# Patient Record
Sex: Male | Born: 1970 | Race: White | Hispanic: No | Marital: Married | Smoking: Never smoker
Health system: Southern US, Community
[De-identification: ages and names within clinical notes are randomized; demographics above are authoritative.]

## PROBLEM LIST (undated history)

## (undated) DIAGNOSIS — K219 Gastro-esophageal reflux disease without esophagitis: Secondary | ICD-10-CM

## (undated) DIAGNOSIS — R7989 Other specified abnormal findings of blood chemistry: Secondary | ICD-10-CM

## (undated) DIAGNOSIS — G4733 Obstructive sleep apnea (adult) (pediatric): Secondary | ICD-10-CM

## (undated) DIAGNOSIS — G43909 Migraine, unspecified, not intractable, without status migrainosus: Secondary | ICD-10-CM

## (undated) DIAGNOSIS — R945 Abnormal results of liver function studies: Secondary | ICD-10-CM

## (undated) DIAGNOSIS — K5792 Diverticulitis of intestine, part unspecified, without perforation or abscess without bleeding: Secondary | ICD-10-CM

## (undated) DIAGNOSIS — M6281 Muscle weakness (generalized): Secondary | ICD-10-CM

## (undated) DIAGNOSIS — E785 Hyperlipidemia, unspecified: Secondary | ICD-10-CM

## (undated) DIAGNOSIS — M75101 Unspecified rotator cuff tear or rupture of right shoulder, not specified as traumatic: Secondary | ICD-10-CM

## (undated) DIAGNOSIS — R29898 Other symptoms and signs involving the musculoskeletal system: Secondary | ICD-10-CM

## (undated) DIAGNOSIS — B179 Acute viral hepatitis, unspecified: Secondary | ICD-10-CM

## (undated) HISTORY — DX: Hyperlipidemia, unspecified: E78.5

## (undated) HISTORY — DX: Other specified abnormal findings of blood chemistry: R79.89

## (undated) HISTORY — DX: Obstructive sleep apnea (adult) (pediatric): G47.33

## (undated) HISTORY — DX: Acute viral hepatitis, unspecified: B17.9

## (undated) HISTORY — DX: Abnormal results of liver function studies: R94.5

## (undated) HISTORY — DX: Gastro-esophageal reflux disease without esophagitis: K21.9

## (undated) HISTORY — DX: Other symptoms and signs involving the musculoskeletal system: R29.898

## (undated) HISTORY — DX: Muscle weakness (generalized): M62.81

## (undated) HISTORY — DX: Diverticulitis of intestine, part unspecified, without perforation or abscess without bleeding: K57.92

---

## 1991-10-15 HISTORY — PX: INGUINAL HERNIA REPAIR: SUR1180

## 1997-10-14 HISTORY — PX: OTHER SURGICAL HISTORY: SHX169

## 1999-08-22 ENCOUNTER — Encounter: Admission: RE | Admit: 1999-08-22 | Discharge: 1999-08-22 | Payer: Self-pay | Admitting: Family Medicine

## 1999-08-22 ENCOUNTER — Encounter: Payer: Self-pay | Admitting: Family Medicine

## 1999-08-28 ENCOUNTER — Encounter: Admission: RE | Admit: 1999-08-28 | Discharge: 1999-08-28 | Payer: Self-pay | Admitting: Family Medicine

## 1999-08-28 ENCOUNTER — Encounter: Payer: Self-pay | Admitting: Family Medicine

## 2001-02-17 ENCOUNTER — Encounter: Payer: Self-pay | Admitting: Neurosurgery

## 2001-02-17 ENCOUNTER — Ambulatory Visit (HOSPITAL_COMMUNITY): Admission: RE | Admit: 2001-02-17 | Discharge: 2001-02-17 | Payer: Self-pay | Admitting: Neurosurgery

## 2001-02-17 HISTORY — PX: LUMBAR LAMINECTOMY/DECOMPRESSION MICRODISCECTOMY: SHX5026

## 2009-03-17 ENCOUNTER — Encounter: Admission: RE | Admit: 2009-03-17 | Discharge: 2009-03-17 | Payer: Self-pay | Admitting: Surgery

## 2009-09-06 ENCOUNTER — Ambulatory Visit (HOSPITAL_BASED_OUTPATIENT_CLINIC_OR_DEPARTMENT_OTHER): Admission: RE | Admit: 2009-09-06 | Discharge: 2009-09-06 | Payer: Self-pay | Admitting: Orthopedic Surgery

## 2009-09-06 HISTORY — PX: KNEE ARTHROSCOPY: SUR90

## 2011-01-16 LAB — POCT HEMOGLOBIN-HEMACUE: Hemoglobin: 16.8 g/dL (ref 13.0–17.0)

## 2011-03-01 NOTE — Op Note (Signed)
[place]  Patient:    Timothy Stout, Timothy Stout                   MRN: [id number] Proc. Date: 02/17/01 Adm. Date:  [id number] Attending:  Gerald Dexter                           Operative Report  PREOPERATIVE DIAGNOSIS:  Herniated disk L5-S1 right.  POSTOPERATIVE DIAGNOSIS:  Herniated disk L5-S1 right.  OPERATION PERFORMED:  Right L5-S1 interlaminal laminotomy for excision of herniated disk with operating microscope.  Microdissection of L5-S1 disk and S1 nerve root.  SURGEON:  Reinaldo Meeker, M.D.  ASSISTANT:  Julio Sicks, M.D.  ANESTHESIA:  General.  DESCRIPTION OF PROCEDURE:  After being placed in the prone position, the patients back was prepped and draped in the usual sterile fashion. Localizing x-ray was taken prior to incision to identify the appropriate level.  A midline incision was made above the spinous processes of L5 and S1. Using the Bovie cutting current, the incision was carried down to the spinous processes.  A subperiosteal dissection was then carried out along the right side of the spinous processes and lamina and McCullough self-retaining retractor was placed for exposure.  A second x-ray was taken to confirm approach to the L5-S1 level and this was correct.  Using the high speed drill, the inferior one third of the L5 lamina and the medial one third of the facet joint were removed.  Drill was then used to remove the superior one third of the S1 lamina.  Residual bone and ligamentum flavum were removed in a piecemeal fashion.  The microscope was draped and brought into the field and used for the remainder of the case.  Using microdissection technique, the lateral aspect of the thecal sac and S1 nerve root were identified.  Further coagulation was carried down toward the canal to identify the L5-S1 disk which was found to be markedly herniated beneath the nerve root.  After coagulating on the annulus, the annulus was  incised with a 15 blade.  Using pituitary rongeurs and curets a very thorough disk space clean out was carried out but at the same time great care was taken to avoid injury to the neural elements and this was successfully done.  At this point inspection was carried out in all directions for any evidence of residual compression and none could be identified.  Large amounts of irrigation were carried out and any bleeding controlled with bipolar coagulation and Gelfoam.  The wound was then closed using interrupted Vicryl on the muscle, fascia, subcutaneous and subcuticular tissues and staples on the skin.  Sterile dressing was then applied.  The patient was extubated and taken to recovery room in stable condition. DD:  02/17/01 TD:  02/17/01 Job: [id number]

## 2012-08-03 ENCOUNTER — Other Ambulatory Visit: Payer: Self-pay | Admitting: Orthopedic Surgery

## 2012-08-17 ENCOUNTER — Encounter (HOSPITAL_BASED_OUTPATIENT_CLINIC_OR_DEPARTMENT_OTHER): Payer: Self-pay | Admitting: *Deleted

## 2012-08-17 NOTE — Progress Notes (Signed)
NPO AFTER MN. ARRIVES AT 0700. NEEDS HG. 

## 2012-08-18 NOTE — Anesthesia Preprocedure Evaluation (Addendum)

## 2012-08-19 ENCOUNTER — Encounter (HOSPITAL_BASED_OUTPATIENT_CLINIC_OR_DEPARTMENT_OTHER): Payer: Self-pay | Admitting: Anesthesiology

## 2012-08-19 ENCOUNTER — Ambulatory Visit (HOSPITAL_BASED_OUTPATIENT_CLINIC_OR_DEPARTMENT_OTHER): Payer: Managed Care, Other (non HMO) | Admitting: Anesthesiology

## 2012-08-19 ENCOUNTER — Encounter (HOSPITAL_BASED_OUTPATIENT_CLINIC_OR_DEPARTMENT_OTHER)
Admission: RE | Disposition: A | Discharge status: Home / Self Care | Payer: Self-pay | Source: Ambulatory Visit | Attending: Orthopedic Surgery

## 2012-08-19 ENCOUNTER — Ambulatory Visit (HOSPITAL_BASED_OUTPATIENT_CLINIC_OR_DEPARTMENT_OTHER)
Admission: RE | Admit: 2012-08-19 | Discharge: 2012-08-19 | Disposition: A | Discharge status: Home / Self Care | Payer: Managed Care, Other (non HMO) | Source: Ambulatory Visit | Attending: Orthopedic Surgery | Admitting: Orthopedic Surgery

## 2012-08-19 ENCOUNTER — Encounter (HOSPITAL_BASED_OUTPATIENT_CLINIC_OR_DEPARTMENT_OTHER): Payer: Self-pay | Admitting: *Deleted

## 2012-08-19 DIAGNOSIS — M7540 Impingement syndrome of unspecified shoulder: Secondary | ICD-10-CM

## 2012-08-19 DIAGNOSIS — X58XXXA Exposure to other specified factors, initial encounter: Secondary | ICD-10-CM | POA: Insufficient documentation

## 2012-08-19 DIAGNOSIS — S43499A Other sprain of unspecified shoulder joint, initial encounter: Secondary | ICD-10-CM | POA: Insufficient documentation

## 2012-08-19 DIAGNOSIS — M25819 Other specified joint disorders, unspecified shoulder: Secondary | ICD-10-CM | POA: Insufficient documentation

## 2012-08-19 DIAGNOSIS — S46819A Strain of other muscles, fascia and tendons at shoulder and upper arm level, unspecified arm, initial encounter: Secondary | ICD-10-CM | POA: Insufficient documentation

## 2012-08-19 DIAGNOSIS — S43429A Sprain of unspecified rotator cuff capsule, initial encounter: Secondary | ICD-10-CM | POA: Insufficient documentation

## 2012-08-19 HISTORY — PX: SHOULDER ARTHROSCOPY WITH SUBACROMIAL DECOMPRESSION: SHX5684

## 2012-08-19 HISTORY — PX: SHOULDER ARTHROSCOPY WITH LABRAL REPAIR: SHX5691

## 2012-08-19 HISTORY — DX: Migraine, unspecified, not intractable, without status migrainosus: G43.909

## 2012-08-19 HISTORY — DX: Unspecified rotator cuff tear or rupture of right shoulder, not specified as traumatic: M75.101

## 2012-08-19 SURGERY — ARTHROSCOPY, SHOULDER, WITH GLENOID LABRUM REPAIR
Anesthesia: General | Site: Shoulder | Laterality: Right

## 2012-08-19 MED ORDER — ONDANSETRON HCL 4 MG/2ML IJ SOLN
INTRAMUSCULAR | Status: DC | PRN
  Administered 2012-08-19: 4 mg via INTRAVENOUS

## 2012-08-19 MED ORDER — PROMETHAZINE HCL 25 MG PO TABS
25.0000 mg | ORAL_TABLET | Freq: Four times a day (QID) | ORAL | Status: DC | PRN
  Administered 2012-08-19: 25 mg via ORAL
  Filled 2012-08-19: qty 1

## 2012-08-19 MED ORDER — FENTANYL CITRATE 0.05 MG/ML IJ SOLN
150.0000 ug | Freq: Once | INTRAMUSCULAR | Status: AC
  Administered 2012-08-19: 150 ug via INTRAVENOUS
  Filled 2012-08-19: qty 3

## 2012-08-19 MED ORDER — POVIDONE-IODINE 7.5 % EX SOLN
Freq: Once | CUTANEOUS | Status: DC
  Filled 2012-08-19: qty 118

## 2012-08-19 MED ORDER — FENTANYL CITRATE 0.05 MG/ML IJ SOLN
INTRAMUSCULAR | Status: DC | PRN
  Administered 2012-08-19: 50 ug via INTRAVENOUS

## 2012-08-19 MED ORDER — MIDAZOLAM BOLUS VIA INFUSION
2.0000 mg | Freq: Once | INTRAVENOUS | Status: AC
  Administered 2012-08-19: 1 mg via INTRAVENOUS
  Filled 2012-08-19: qty 2

## 2012-08-19 MED ORDER — LIDOCAINE HCL (CARDIAC) 20 MG/ML IV SOLN
INTRAVENOUS | Status: DC | PRN
  Administered 2012-08-19: 100 mg via INTRAVENOUS

## 2012-08-19 MED ORDER — DEXAMETHASONE SODIUM PHOSPHATE 4 MG/ML IJ SOLN
INTRAMUSCULAR | Status: DC | PRN
  Administered 2012-08-19: 10 mg via INTRAVENOUS

## 2012-08-19 MED ORDER — LACTATED RINGERS IV SOLN
INTRAVENOUS | Status: DC
  Filled 2012-08-19: qty 1000

## 2012-08-19 MED ORDER — OXYCODONE-ACETAMINOPHEN 7.5-325 MG PO TABS: 1.0000 | ORAL_TABLET | ORAL | Status: DC | PRN

## 2012-08-19 MED ORDER — FENTANYL CITRATE 0.05 MG/ML IJ SOLN
25.0000 ug | INTRAMUSCULAR | Status: DC | PRN
  Filled 2012-08-19: qty 1

## 2012-08-19 MED ORDER — SODIUM CHLORIDE 0.9 % IR SOLN
Status: DC | PRN
  Administered 2012-08-19: 09:00:00

## 2012-08-19 MED ORDER — ROPIVACAINE HCL 5 MG/ML IJ SOLN
INTRAMUSCULAR | Status: DC | PRN
  Administered 2012-08-19: 30 mL

## 2012-08-19 MED ORDER — SUCCINYLCHOLINE CHLORIDE 20 MG/ML IJ SOLN
INTRAMUSCULAR | Status: DC | PRN
  Administered 2012-08-19: 100 mg via INTRAVENOUS

## 2012-08-19 MED ORDER — PROPOFOL 10 MG/ML IV BOLUS
INTRAVENOUS | Status: DC | PRN
  Administered 2012-08-19: 200 mg via INTRAVENOUS

## 2012-08-19 MED ORDER — PROMETHAZINE HCL 50 MG PO TABS: 50.0000 mg | ORAL_TABLET | Freq: Four times a day (QID) | ORAL | Status: DC | PRN

## 2012-08-19 MED ORDER — METHOCARBAMOL 500 MG PO TABS: 500.0000 mg | ORAL_TABLET | Freq: Four times a day (QID) | ORAL | Status: DC

## 2012-08-19 MED ORDER — FENTANYL CITRATE 0.05 MG/ML IJ SOLN
100.0000 ug | Freq: Once | INTRAMUSCULAR | Status: DC
  Filled 2012-08-19: qty 2

## 2012-08-19 MED ORDER — BUPIVACAINE-EPINEPHRINE 0.5% -1:200000 IJ SOLN
INTRAMUSCULAR | Status: DC | PRN
  Administered 2012-08-19: 12 mL

## 2012-08-19 MED ORDER — LACTATED RINGERS IV SOLN
INTRAVENOUS | Status: DC
  Administered 2012-08-19: 100 mL/h via INTRAVENOUS
  Administered 2012-08-19: 09:00:00 via INTRAVENOUS
  Filled 2012-08-19: qty 1000

## 2012-08-19 SURGICAL SUPPLY — 75 items
APL SKNCLS STERI-STRIP NONHPOA (GAUZE/BANDAGES/DRESSINGS)
BENZOIN TINCTURE PRP APPL 2/3 (GAUZE/BANDAGES/DRESSINGS) IMPLANT
BLADE 4.2CUDA (BLADE) ×2 IMPLANT
BLADE CUDA 4.2 (BLADE) IMPLANT
BLADE CUDA 5.5 (BLADE) IMPLANT
BLADE CUDA SHAVER 3.5 (BLADE) IMPLANT
BLADE CUTTER GATOR 3.5 (BLADE) IMPLANT
BLADE FLAT COURSE (BLADE) IMPLANT
BLADE GREAT WHITE 4.2 (BLADE) IMPLANT
BLADE SURG 10 STRL SS (BLADE) IMPLANT
BLADE SURG 15 STRL LF DISP TIS (BLADE) IMPLANT
BLADE SURG 15 STRL SS (BLADE)
BUR OVAL 4.0 (BURR) ×2 IMPLANT
CANISTER SUCT LVC 12 LTR MEDI- (MISCELLANEOUS) ×3 IMPLANT
CANISTER SUCTION 1200CC (MISCELLANEOUS) ×2 IMPLANT
CANISTER SUCTION 2500CC (MISCELLANEOUS) ×1 IMPLANT
CLOTH BEACON ORANGE TIMEOUT ST (SAFETY) ×2 IMPLANT
DRAPE LG THREE QUARTER DISP (DRAPES) ×4 IMPLANT
DRAPE SHOULDER BEACH CHAIR (DRAPES) ×2 IMPLANT
DRAPE U-SHAPE 47X51 STRL (DRAPES) ×2 IMPLANT
DRSG ADAPTIC 3X8 NADH LF (GAUZE/BANDAGES/DRESSINGS) ×2 IMPLANT
DRSG EMULSION OIL 3X3 NADH (GAUZE/BANDAGES/DRESSINGS) ×1 IMPLANT
DRSG PAD ABDOMINAL 8X10 ST (GAUZE/BANDAGES/DRESSINGS) ×2 IMPLANT
DURAPREP 26ML APPLICATOR (WOUND CARE) ×2 IMPLANT
ELECT MENISCUS 165MM 90D (ELECTRODE) IMPLANT
ELECT REM PT RETURN 9FT ADLT (ELECTROSURGICAL) ×2
ELECTRODE REM PT RTRN 9FT ADLT (ELECTROSURGICAL) ×1 IMPLANT
GLOVE BIO SURGEON STRL SZ 6.5 (GLOVE) ×2 IMPLANT
GLOVE BIOGEL PI IND STRL 8 (GLOVE) ×1 IMPLANT
GLOVE BIOGEL PI INDICATOR 8 (GLOVE) ×1
GLOVE ECLIPSE 6.0 STRL STRAW (GLOVE) ×1 IMPLANT
GLOVE ECLIPSE 8.0 STRL XLNG CF (GLOVE) ×3 IMPLANT
GLOVE INDICATOR 8.0 STRL GRN (GLOVE) ×3 IMPLANT
GOWN PREVENTION PLUS LG XLONG (DISPOSABLE) ×2 IMPLANT
GOWN STRL REIN XL XLG (GOWN DISPOSABLE) ×4 IMPLANT
IV NS IRRIG 3000ML ARTHROMATIC (IV SOLUTION) ×4 IMPLANT
NDL 1/2 CIR CATGUT .05X1.09 (NEEDLE) IMPLANT
NDL HYPO 18GX1.5 BLUNT FILL (NEEDLE) ×1 IMPLANT
NDL SAFETY ECLIPSE 18X1.5 (NEEDLE) ×1 IMPLANT
NEEDLE 1/2 CIR CATGUT .05X1.09 (NEEDLE) IMPLANT
NEEDLE HYPO 18GX1.5 BLUNT FILL (NEEDLE) ×2 IMPLANT
NEEDLE HYPO 18GX1.5 SHARP (NEEDLE) ×2
NEEDLE HYPO 22GX1.5 SAFETY (NEEDLE) IMPLANT
NS IRRIG 500ML POUR BTL (IV SOLUTION) ×1 IMPLANT
PACK ARTHROSCOPY DSU (CUSTOM PROCEDURE TRAY) ×2 IMPLANT
PACK BASIN DAY SURGERY FS (CUSTOM PROCEDURE TRAY) ×2 IMPLANT
PENCIL BUTTON HOLSTER BLD 10FT (ELECTRODE) IMPLANT
SET ARTHROSCOPY TUBING (MISCELLANEOUS) ×2
SET ARTHROSCOPY TUBING LN (MISCELLANEOUS) ×1 IMPLANT
SLING ARM IMMOBILIZER LRG (SOFTGOODS) ×1 IMPLANT
SPONGE GAUZE 4X4 12PLY (GAUZE/BANDAGES/DRESSINGS) ×2 IMPLANT
SPONGE SURGIFOAM ABS GEL 100 (HEMOSTASIS) IMPLANT
STAPLER VISISTAT (STAPLE) IMPLANT
STRIP CLOSURE SKIN 1/2X4 (GAUZE/BANDAGES/DRESSINGS) IMPLANT
SUCTION FRAZIER TIP 10 FR DISP (SUCTIONS) ×1 IMPLANT
SUT BONE WAX W31G (SUTURE) IMPLANT
SUT ETHIBOND GREEN BRAID 0S 4 (SUTURE) IMPLANT
SUT ETHIBOND NAB CT1 #1 30IN (SUTURE) IMPLANT
SUT ETHILON 4 0 PS 2 18 (SUTURE) ×3 IMPLANT
SUT VIC AB 0 CT1 36 (SUTURE) IMPLANT
SUT VIC AB 1 CT1 36 (SUTURE) ×1 IMPLANT
SUT VIC AB 2-0 CT1 27 (SUTURE)
SUT VIC AB 2-0 CT1 TAPERPNT 27 (SUTURE) ×1 IMPLANT
SUT VIC AB 3-0 CT1 27 (SUTURE)
SUT VIC AB 3-0 CT1 TAPERPNT 27 (SUTURE) IMPLANT
SUT VIC AB 3-0 SH 27 (SUTURE)
SUT VIC AB 3-0 SH 27X BRD (SUTURE) IMPLANT
SUT VICRYL 4-0 PS2 18IN ABS (SUTURE) IMPLANT
SYR BULB IRRIGATION 50ML (SYRINGE) ×2 IMPLANT
SYRINGE 10CC LL (SYRINGE) ×2 IMPLANT
TAPE HYPAFIX 6X30 (GAUZE/BANDAGES/DRESSINGS) ×1 IMPLANT
TOWEL OR 17X24 6PK STRL BLUE (TOWEL DISPOSABLE) ×2 IMPLANT
TUBE CONNECTING 12X1/4 (SUCTIONS) ×2 IMPLANT
WAND 90 DEG TURBOVAC W/CORD (SURGICAL WAND) ×2 IMPLANT
WATER STERILE IRR 500ML POUR (IV SOLUTION) ×2 IMPLANT

## 2012-08-19 NOTE — Transfer of Care (Signed)
Immediate Anesthesia Transfer of Care Note  Patient: Timothy Stout  Procedure(s) Performed: Procedure(s) (LRB) with comments: SHOULDER ARTHROSCOPY WITH LABRAL REPAIR (Right) - shaving of rotaor cuff SHOULDER ARTHROSCOPY WITH SUBACROMIAL DECOMPRESSION (Right)  Patient Location: PACU  Anesthesia Type:General  Level of Consciousness: awake, alert  and oriented  Airway & Oxygen Therapy: Patient Spontanous Breathing and Patient connected to face mask oxygen  Post-op Assessment: Report given to PACU RN  Post vital signs: Reviewed and stable  Complications: No apparent anesthesia complications

## 2012-08-19 NOTE — Anesthesia Postprocedure Evaluation (Signed)
  Anesthesia Post-op Note  Patient: Timothy Stout  Procedure(s) Performed: Procedure(s) (LRB): SHOULDER ARTHROSCOPY WITH LABRAL REPAIR (Right) SHOULDER ARTHROSCOPY WITH SUBACROMIAL DECOMPRESSION (Right)  Patient Location: PACU  Anesthesia Type: General  Level of Consciousness: awake and alert   Airway and Oxygen Therapy: Patient Spontanous Breathing  Post-op Pain: mild  Post-op Assessment: Post-op Vital signs reviewed, Patient's Cardiovascular Status Stable, Respiratory Function Stable, Patent Airway and No signs of Nausea or vomiting  Post-op Vital Signs: stable  Complications: No apparent anesthesia complications

## 2012-08-19 NOTE — Brief Op Note (Signed)
08/19/2012  9:58 AM  PATIENT:  [name]  41 y.o. male  PRE-OPERATIVE DIAGNOSIS:  right shoulder partial rotator cuff tear   POST-OPERATIVE DIAGNOSIS:  right shoulder partial rotator cuff tear   PROCEDURE:  Procedure(s) (LRB) with comments: SHOULDER ARTHROSCOPY WITH LABRAL REPAIR (Right) - shaving of rotaor cuff SHOULDER ARTHROSCOPY WITH SUBACROMIAL DECOMPRESSION (Right)  SURGEON:  Surgeon(s) and Role:    * Drucilla Schmidt, MD - Primary  PHYSICIAN ASSISTANT:   ASSISTANTS:extra scrub nurse  ANESTHESIA:   regional and general  EBL:  Total I/O In: 1000 [I.V.:1000] Out: -   BLOOD ADMINISTERED:none  DRAINS: none   LOCAL MEDICATIONS USED:  MARCAINE     SPECIMEN:  No Specimen  DISPOSITION OF SPECIMEN:  N/A  COUNTS:  YES  TOURNIQUET:  * No tourniquets in log *  DICTATION: .Other Dictation: Dictation Number [id number]  PLAN OF CARE: Discharge to home after PACU  PATIENT DISPOSITION:  PACU - hemodynamically stable.   Delay start of Pharmacological VTE agent (>24hrs) due to surgical blood loss or risk of bleeding: yes

## 2012-08-19 NOTE — Anesthesia Procedure Notes (Addendum)
Anesthesia Regional Block:  Interscalene brachial plexus block  Pre-Anesthetic Checklist: ,, timeout performed, Correct Patient, Correct Site, Correct Laterality, Correct Procedure, Correct Position, site marked, Risks and benefits discussed,  Surgical consent,  Pre-op evaluation,  At surgeon's request and post-op pain management  Laterality: Right  Prep: chloraprep       Needles:  Injection technique: Single-shot  Needle Type: Stimiplex          Additional Needles:  Procedures: ultrasound guided (picture in chart) Interscalene brachial plexus block Narrative:  Start time: 08/19/2012 8:20 AM End time: 08/19/2012 8:30 AM Injection made incrementally with aspirations every 5 mL.  Performed by: Personally  Anesthesiologist: Gaetano Hawthorne MD  Additional Notes: Patient tolerated the procedure well without complications  Interscalene brachial plexus block Procedure Name: Intubation Date/Time: 08/19/2012 8:47 AM Performed by: Maris Berger T Pre-anesthesia Checklist: Patient identified, Emergency Drugs available, Suction available and Patient being monitored Patient Re-evaluated:Patient Re-evaluated prior to inductionOxygen Delivery Method: Circle System Utilized Preoxygenation: Pre-oxygenation with 100% oxygen Intubation Type: IV induction Ventilation: Mask ventilation without difficulty Laryngoscope Size: Mac and 4 Tube type: Oral Tube size: 8.0 mm Number of attempts: 1 Airway Equipment and Method: stylet and oral airway Placement Confirmation: ETT inserted through vocal cords under direct vision,  positive ETCO2 and breath sounds checked- equal and bilateral Secured at: 23 cm Tube secured with: Tape Dental Injury: Teeth and Oropharynx as per pre-operative assessment

## 2012-08-19 NOTE — H&P (Signed)
Timothy Stout is an 41 y.o. male.   Chief Complaint: painful rt shoulder HPI:MRI demonstrates partial rotator cuff tear  Past Medical History  Diagnosis Date  . Rotator cuff tear, right   . Migraine     Past Surgical History  Procedure Date  . Knee arthroscopy 09-06-2009    LEFT KNEE  . Lumbar laminectomy/decompression microdiscectomy 02-17-2001    L5 - S1  . Inguinal hernia repair 1993  . Right knee arthroscopy 1999    History reviewed. No pertinent family history. Social History:  reports that he has never smoked. He has never used smokeless tobacco. He reports that he drinks alcohol. He reports that he does not use illicit drugs.  Allergies: No Known Allergies  Medications Prior to Admission  Medication Sig Dispense Refill  . aspirin 81 MG tablet Take 81 mg by mouth daily.      Marland Kitchen ibuprofen (ADVIL,MOTRIN) 200 MG tablet Take 200 mg by mouth every 6 (six) hours as needed.      . Magnesium 400 MG CAPS Take 1 capsule by mouth daily.      . naproxen sodium (ANAPROX) 220 MG tablet Take 220 mg by mouth as needed.      . rizatriptan (MAXALT) 10 MG tablet Take 10 mg by mouth as needed. May repeat in 2 hours if needed        No results found for this or any previous visit (from the past 48 hour(s)). No results found.  ROS  Blood pressure 134/85, pulse 74, temperature 97.4 F (36.3 C), temperature source Oral, resp. rate 20, height 5\' 10"  (1.778 m), weight 93.583 kg (206 lb 5 oz), SpO2 99.00%. Physical Exam  Constitutional: He is oriented to person, place, and time. He appears well-developed and well-nourished.  HENT:  Head: Normocephalic and atraumatic.  Right Ear: External ear normal.  Left Ear: External ear normal.  Nose: Nose normal.  Mouth/Throat: Oropharynx is clear and moist.  Eyes: Conjunctivae normal and EOM are normal. Pupils are equal, round, and reactive to light.  Neck: Normal range of motion. Neck supple.  Cardiovascular: Normal rate, regular rhythm,  normal heart sounds and intact distal pulses.   Respiratory: Effort normal and breath sounds normal.  GI: Soft. Bowel sounds are normal.  Musculoskeletal: Normal range of motion. He exhibits tenderness.       Tender rotator cuff rt shoulder  Neurological: He is alert and oriented to person, place, and time. He has normal reflexes.  Skin: Skin is warm and dry.  Psychiatric: He has a normal mood and affect. His behavior is normal. Judgment and thought content normal.     Assessment/Plan Partial rotator cuff tear rt shoulder Rt shoulder arthroscopy wit SAD and shaving rotator cuff  APLINGTON,JAMES P 08/19/2012, 8:22 AM

## 2012-08-20 ENCOUNTER — Encounter (HOSPITAL_BASED_OUTPATIENT_CLINIC_OR_DEPARTMENT_OTHER): Payer: Self-pay | Admitting: Orthopedic Surgery

## 2012-08-20 NOTE — Op Note (Signed)
NAME:  Timothy Stout, Timothy Stout NO.:  [id number]  MEDICAL RECORD NO.:  [id number]  LOCATION:                                 FACILITY:  PHYSICIAN:  Marlowe Kays, M.D.       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  OPERATION: 1. Right shoulder arthroscopy with debridement of labrum and shaving     of articular surface of rotator cuff. 2. Arthroscopic subacromial decompression with shaving of bursal     surface of the rotator cuff.  SURGEON:  Marlowe Kays, MD  ASSISTANT:  Extra scrub tech.  ANESTHESIA:  General preceded by interscalene block.  PATHOLOGY AND JUSTIFICATION FOR PROCEDURE:  Painful right shoulder with an MRI demonstrating the preoperative diagnoses.  PROCEDURE:  Satisfactory interscalene block by anesthesia, satisfied general anesthesia, beach chair position on sliding frame.  Right shoulder girdle was prepped with DuraPrep, draped in sterile field. Time-out performed.  Anatomy of shoulder joint was marked out and for hemostatic purposes, I injected the posterior soft spot portal, the lateral portal, and the subacromial space with Marcaine with adrenaline. I then was able to atraumatically enter the glenohumeral joint with the blunt trocar and on inspection the MRI findings were confirmed.  The biceps tendon, humeral head looked normal.  There were no arthritic changes in the joint but there was tearing of the labrum, which I pictured.  I also pictured the articular surface tearing of the undersurface of the rotator cuff.  Accordingly, I advanced the scope between the biceps tendon and subscapularis and using a switching stick, made an anterior  incision over which I placed a metal cannula followed by 4.2 shaver entering in the joint.  I then debrided down the labrum and the undersurface of the rotator cuff.  Final pictures were taken.  I then directed the scope in the subacromial space and through a lateral portal introduced a blunt trocar followed by 4.2 shaver.  He had a very dense subacromial bursa.  I removed lot of that with the 4.2 shaver and then used the 90-degree ArthroCare vaporizer to remove soft tissue from the undersurface of the acromion and some additional bursal tissue.  I followed this with a 4 mm oval bur, burring down the undersurface of the acromion and moved back and forth between these 3 instruments until we had a wide decompression, roughened up bursal surface of the rotator cuff.  I shaved as well with the 4.2 shaver.  When I felt that we had a wide decompression and I shaved the rotator cuff as much as needed, I also inspected it to make sure there was no full-thickness tear.  All fluid possible was removed and I closed the portals with 4-0 nylon. Betadine, Adaptic, dry sterile dressing, shoulder immobilizer applied. He tolerated the procedure well and was taken to the recovery room in satisfactory condition with no known complications.          ______________________________  Marlowe Kays, M.D.     JA/MEDQ  D:  08/19/2012  T:  08/20/2012  Job:  [id number]

## 2013-02-22 ENCOUNTER — Other Ambulatory Visit: Payer: Self-pay

## 2013-02-22 ENCOUNTER — Emergency Department (HOSPITAL_COMMUNITY)
Admission: EM | Admit: 2013-02-22 | Discharge: 2013-02-22 | Disposition: A | Discharge status: Home / Self Care | Payer: Managed Care, Other (non HMO) | Attending: Emergency Medicine | Admitting: Emergency Medicine

## 2013-02-22 ENCOUNTER — Emergency Department (HOSPITAL_COMMUNITY): Payer: Managed Care, Other (non HMO)

## 2013-02-22 DIAGNOSIS — Z87828 Personal history of other (healed) physical injury and trauma: Secondary | ICD-10-CM | POA: Insufficient documentation

## 2013-02-22 DIAGNOSIS — Z7982 Long term (current) use of aspirin: Secondary | ICD-10-CM | POA: Insufficient documentation

## 2013-02-22 DIAGNOSIS — G43909 Migraine, unspecified, not intractable, without status migrainosus: Secondary | ICD-10-CM | POA: Insufficient documentation

## 2013-02-22 DIAGNOSIS — R079 Chest pain, unspecified: Secondary | ICD-10-CM

## 2013-02-22 LAB — CBC
Hemoglobin: 15.9 g/dL (ref 13.0–17.0)
MCH: 31.6 pg (ref 26.0–34.0)
MCV: 87.3 fL (ref 78.0–100.0)
RBC: 5.03 MIL/uL (ref 4.22–5.81)

## 2013-02-22 LAB — POCT I-STAT TROPONIN I

## 2013-02-22 LAB — BASIC METABOLIC PANEL
BUN: 14 mg/dL (ref 6–23)
CO2: 30 mEq/L (ref 19–32)
Calcium: 9.7 mg/dL (ref 8.4–10.5)
Glucose, Bld: 98 mg/dL (ref 70–99)
Sodium: 140 mEq/L (ref 135–145)

## 2013-02-22 LAB — D-DIMER, QUANTITATIVE: D-Dimer, Quant: <0.27 ug/mL-FEU (ref 0.00–0.48)

## 2013-02-22 MED ORDER — ASPIRIN 81 MG PO CHEW
CHEWABLE_TABLET | ORAL | Status: AC
  Filled 2013-02-22: qty 4

## 2013-02-22 MED ORDER — ASPIRIN 325 MG PO TABS: 325.0000 mg | ORAL_TABLET | ORAL | Status: DC

## 2013-02-22 MED ORDER — ASPIRIN 81 MG PO CHEW
324.0000 mg | CHEWABLE_TABLET | Freq: Once | ORAL | Status: AC
  Administered 2013-02-22: 243 mg via ORAL

## 2013-02-22 NOTE — ED Notes (Signed)
Pt c/o Right side jaw tightness and then just after that subsided he developed Right side chest pain starting approx 3 hours ago. Pt denies N/V, abd/back pain, cough, congestion, weakness, diaphoretic, SOB, or dizziness. Pt currently pain free, pt does report increase indigestion after the chest tightness.

## 2013-02-22 NOTE — ED Notes (Addendum)
Pt alert, NAD, calm, interactive, skin dry, resps e/u, speaking in clear complete sentences, to xray via stretcher, visitor remains in room. (denies: pain, nausea, sob or other sx).

## 2013-02-22 NOTE — ED Provider Notes (Signed)
History     CSN: [id number]  Arrival date & time 02/22/13  0040   First MD Initiated Contact with Patient 02/22/13 [phone]      Chief Complaint  Patient presents with  . Chest Pain   HPI  History provided by the patient. Patient is a 42 year old male with history of migraine headaches previous right rotator cuff surgery who presents with complaints of brief episode of central chest pain. Symptoms occurred briefly wet prior to arrival. Patient states she first felt tightness and unusual sensation to his lower jaw. This was followed by a central chest soreness and heaviness that lasted 30 seconds. Patient was at rest at that time. Symptoms have not returned. Patient denies having similar symptoms previously. He does report recently traveling back from [place] with family after visiting amusement parks. They drove but had frequent stops every 2-3 hours. He denied having any swelling or pain to his lower extremities. Denies any associated cough, shortness of breath or heart palpitations. Denies any hemoptysis. Denies any nausea or vomiting. No sweats. Patient does also mention having 2 large meals today to celebrate Mother's Day which was unusual. He denied having any heartburn or abdominal pain. No other aggravating or alleviating factors. No other associated symptoms.     Past Medical History  Diagnosis Date  . Rotator cuff tear, right   . Migraine     Past Surgical History  Procedure Laterality Date  . Knee arthroscopy  09-06-2009    LEFT KNEE  . Lumbar laminectomy/decompression microdiscectomy  02-17-2001    L5 - S1  . Inguinal hernia repair  1993  . Right knee arthroscopy  1999  . Shoulder arthroscopy with labral repair  08/19/2012    Procedure: SHOULDER ARTHROSCOPY WITH LABRAL REPAIR;  Surgeon: Drucilla Schmidt, MD;  Location: [facility];  Service: Orthopedics;  Laterality: Right;  shaving of rotaor cuff  . Shoulder arthroscopy with subacromial decompression   08/19/2012    Procedure: SHOULDER ARTHROSCOPY WITH SUBACROMIAL DECOMPRESSION;  Surgeon: Drucilla Schmidt, MD;  Location: [facility];  Service: Orthopedics;  Laterality: Right;    No family history on file.  History  Substance Use Topics  . Smoking status: Never Smoker   . Smokeless tobacco: Never Used  . Alcohol Use: Yes     Comment: OCCASIONAL      Review of Systems  Constitutional: Negative for fever, chills and diaphoresis.  Respiratory: Negative for cough and shortness of breath.   Cardiovascular: Positive for chest pain. Negative for palpitations.  Gastrointestinal: Negative for nausea, vomiting and abdominal pain.  All other systems reviewed and are negative.    Allergies  Review of patient's allergies indicates no known allergies.  Home Medications   Current Outpatient Rx  Name  Route  Sig  Dispense  Refill  . aspirin 81 MG tablet   Oral   Take 81 mg by mouth daily.         . magnesium oxide (MAG-OX) 400 MG tablet   Oral   Take 400 mg by mouth daily.         . rizatriptan (MAXALT) 10 MG tablet   Oral   Take 10 mg by mouth as needed. May repeat in 2 hours if needed           BP 131/85  Pulse 85  Temp(Src) 98.7 F (37.1 C)  Resp 17  SpO2 97%  Physical Exam  Nursing note and vitals reviewed. Constitutional: He is oriented to person,  place, and time. He appears well-developed and well-nourished. No distress.  HENT:  Head: Normocephalic.  Cardiovascular: Normal rate and regular rhythm.   No murmur heard. Pulmonary/Chest: Effort normal and breath sounds normal. No respiratory distress. He has no wheezes. He has no rales.  Abdominal: Soft. There is no tenderness. There is no rebound and no guarding.  Musculoskeletal: Normal range of motion. He exhibits no edema and no tenderness.  No clinical signs concerning for DVT  Neurological: He is alert and oriented to person, place, and time.  Skin: Skin is warm. He is not diaphoretic.   Psychiatric: He has a normal mood and affect. His behavior is normal.    ED Course  Procedures   Results for orders placed during the hospital encounter of 02/22/13  CBC      Result Value Range   WBC 6.2  4.0 - 10.5 K/uL   RBC 5.03  4.22 - 5.81 MIL/uL   Hemoglobin 15.9  13.0 - 17.0 g/dL   HCT 45.4  09.8 - 11.9 %   MCV 87.3  78.0 - 100.0 fL   MCH 31.6  26.0 - 34.0 pg   MCHC 36.2 (*) 30.0 - 36.0 g/dL   RDW 14.7  82.9 - 56.2 %   Platelets 156  150 - 400 K/uL  BASIC METABOLIC PANEL      Result Value Range   Sodium 140  135 - 145 mEq/L   Potassium 4.1  3.5 - 5.1 mEq/L   Chloride 103  96 - 112 mEq/L   CO2 30  19 - 32 mEq/L   Glucose, Bld 98  70 - 99 mg/dL   BUN 14  6 - 23 mg/dL   Creatinine, Ser 1.30  0.50 - 1.35 mg/dL   Calcium 9.7  8.4 - 86.5 mg/dL   GFR calc non Af Amer 89 (*) >90 mL/min   GFR calc Af Amer >90  >90 mL/min  D-DIMER, QUANTITATIVE      Result Value Range   D-Dimer, Quant <0.27  0.00 - 0.48 ug/mL-FEU  POCT I-STAT TROPONIN I      Result Value Range   Troponin i, poc 0.00  0.00 - 0.08 ng/mL   Comment 3           POCT I-STAT TROPONIN I      Result Value Range   Troponin i, poc 0.00  0.00 - 0.08 ng/mL   Comment 3                Dg Chest 2 View  02/22/2013  *RADIOLOGY REPORT*  Clinical Data: Chest pain  CHEST - 2 VIEW  Comparison: 03/17/2009 abdominal CT  Findings: Lungs are clear. No pleural effusion or pneumothorax. The cardiomediastinal contours are within normal limits. The visualized bones and soft tissues are without significant appreciable abnormality.  IMPRESSION: No radiographic evidence of acute cardiopulmonary process.   Original Report Authenticated By: Jearld Lesch, M.D.      1. Chest pain       MDM  3:15 AM patient seen and evaluated. Patient resting comfortably he appears well in no acute distress. Currently without symptoms.  Patient with brief 30 seconds central chest pain without recurrence. Labs, EKG, chest x-ray troponin x2,  d-dimer unremarkable. This time patient is stable for discharge home to followup with PCP.       Date: 02/22/2013  Rate: 66  Rhythm: normal sinus rhythm and sinus arrhythmia  QRS Axis: normal  Intervals: normal  ST/T Wave abnormalities:  normal  Conduction Disutrbances:none  Narrative Interpretation:   Old EKG Reviewed: none available    Angus Seller, PA-C 02/22/13 [phone]

## 2013-02-22 NOTE — ED Notes (Signed)
ekg done at 0046 hours

## 2013-02-22 NOTE — ED Notes (Signed)
Pt c/o central and right sided chest pain with radiation to jaw. Pain exacerbated by nothing, Pain relived by nothing. Pain currently resolved. Lasted 30 seconds

## 2013-02-22 NOTE — ED Provider Notes (Signed)
Medical screening examination/treatment/procedure(s) were performed by non-physician practitioner and as supervising physician I was immediately available for consultation/collaboration.   Charles B. Sheldon, MD 02/22/13 0629 

## 2013-02-22 NOTE — ED Notes (Signed)
The patient is AOx4 and comfortable with his discharge instructions. 

## 2013-02-22 NOTE — ED Notes (Signed)
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

## 2014-03-01 ENCOUNTER — Encounter (INDEPENDENT_AMBULATORY_CARE_PROVIDER_SITE_OTHER): Payer: Self-pay

## 2014-03-01 ENCOUNTER — Encounter: Payer: Self-pay | Admitting: Cardiology

## 2014-03-01 ENCOUNTER — Ambulatory Visit (INDEPENDENT_AMBULATORY_CARE_PROVIDER_SITE_OTHER): Payer: BC Managed Care – PPO | Admitting: Cardiology

## 2014-03-01 VITALS — BP 144/88 | HR 79 | Ht 70.0 in | Wt 217.0 lb

## 2014-03-01 DIAGNOSIS — R079 Chest pain, unspecified: Secondary | ICD-10-CM | POA: Insufficient documentation

## 2014-03-01 NOTE — Patient Instructions (Signed)
Your physician recommends that you continue on your current medications as directed. Please refer to the Current Medication list given to you today.  Your physician has requested that you have an exercise stress myoview. For further information please visit [email or web address]. Please follow instruction sheet, as given.  Your physician recommends that you schedule a follow-up appointment As Needed 

## 2014-03-01 NOTE — Progress Notes (Signed)
  [address] [place], [place]  [address] Phone: [phone] Fax:  [phone]  Date:  03/01/2014   ID:  Timothy Stout, DOB 20-Dec-1970, MRN [id number]  PCP:  Lolita PatellaEADE,ROBERT ALEXANDER, MD  Cardiologist:  Armanda Magicraci Timothy Gafford, MD     History of Present Illness: Timothy Stout is a 43 y.o. male with a no prior cardiac history presents for cardiac clearance for .  He was seen in the ER a year ago with complaints of chest pain located centrally that he describes as a tightness with radiation to the jaw and chest soreness.  This occurred after he had had 2 large meals the same day on Mother's day.  He denied any associated symptoms of SOB, diaphoresis or nausea.  Cardiac workup was normal in the ER and he is now presents for further evaluation. He was supposed to followup a year ago but did not but started to get worried since he has been having some muscle soreness in his chest that is nonexertional.  He can jog on the treadmill or run around the soccer field without any problems. He denies any SOB, DOE, LE edema, dizziness, palpitations or syncope.   Wt Readings from Last 3 Encounters:  03/01/14 217 lb (98.431 kg)  08/19/12 206 lb 5 oz (93.583 kg)  08/19/12 206 lb 5 oz (93.583 kg)     Past Medical History  Diagnosis Date  . Rotator cuff tear, right   . Migraine     Current Outpatient Prescriptions  Medication Sig Dispense Refill  . aspirin 81 MG tablet Take 81 mg by mouth daily.      . magnesium oxide (MAG-OX) 400 MG tablet Take 400 mg by mouth daily.      . rizatriptan (MAXALT) 10 MG tablet Take 10 mg by mouth as needed. May repeat in 2 hours if needed       No current facility-administered medications for this visit.    Allergies:   No Known Allergies  Social History:  The patient  reports that he has never smoked. He has never used smokeless tobacco. He reports that he drinks alcohol. He reports that he does not use illicit drugs.   Family History:  The  patient's family history is not on file.   ROS:  Please see the history of present illness.      All other systems reviewed and negative.   PHYSICAL EXAM: VS:  BP 144/88  Pulse 79  Ht 5\' 10"  (1.778 m)  Wt 217 lb (98.431 kg)  BMI 31.14 kg/m2 Well nourished, well developed, in no acute distress HEENT: normal Neck: no JVD Cardiac:  normal S1, S2; RRR; no murmur Lungs:  clear to auscultation bilaterally, no wheezing, rhonchi or rales Abd: soft, nontender, no hepatomegaly Ext: no edema Skin: warm and dry Neuro:  CNs 2-12 intact, no focal abnormalities noted  EKG:     NSR with rightward axis  ASSESSMENT AND PLAN:  1. Chest pain with typical and atypical components.  EKG is nonischemic. - check stress myoview - check FLP since he has not had his lipids checked in 2 years   Followup with me PRN  Signed, Armanda Magicraci Shakora Nordquist, MD 03/01/2014 3:51 PM

## 2014-03-10 ENCOUNTER — Telehealth: Payer: Self-pay | Admitting: Cardiology

## 2014-03-10 NOTE — Telephone Encounter (Signed)
New Message:  Pt wants to know if he can take his meds and drink water prior to his Nuc Stress Test. I informed the pt his test will last 3 to 4 hours. Nothing to eat or drink 4 hours prior and no caffeine at least 12 hours prior. Pt wants to know about water and medications. Pt is requesting a call back from the nurse.

## 2014-03-10 NOTE — Telephone Encounter (Signed)
Lm on voice mail that he could have sip of water with his medications but the medications that he takes can be delayed until after study. So will need to be NPO. Lm message to call back if had any further questions.

## 2014-03-15 ENCOUNTER — Encounter (HOSPITAL_COMMUNITY): Payer: BC Managed Care – PPO

## 2014-03-29 ENCOUNTER — Ambulatory Visit (HOSPITAL_COMMUNITY): Payer: BC Managed Care – PPO | Attending: Cardiology | Admitting: Radiology

## 2014-03-29 VITALS — BP 131/76 | HR 68 | Ht 70.0 in | Wt 211.0 lb

## 2014-03-29 DIAGNOSIS — R0789 Other chest pain: Secondary | ICD-10-CM | POA: Insufficient documentation

## 2014-03-29 DIAGNOSIS — R079 Chest pain, unspecified: Secondary | ICD-10-CM

## 2014-03-29 MED ORDER — TECHNETIUM TC 99M SESTAMIBI GENERIC - CARDIOLITE
11.0000 | Freq: Once | INTRAVENOUS | Status: AC | PRN
  Administered 2014-03-29: 11 via INTRAVENOUS

## 2014-03-29 MED ORDER — TECHNETIUM TC 99M SESTAMIBI GENERIC - CARDIOLITE
33.0000 | Freq: Once | INTRAVENOUS | Status: AC | PRN
  Administered 2014-03-29: 33 via INTRAVENOUS

## 2014-03-29 NOTE — Progress Notes (Signed)
[facility] SITE 3 NUCLEAR MED [address] [place], [place] [address] [phone]    Cardiology Nuclear Med Study  Noemi Chapelubrey Scott Remi HaggardCornelius is a 43 y.o. male     MRN : [id number]     DOB: 1971-08-06  Procedure Date: 03/29/2014  Nuclear Med Background Indication for Stress Test:  Evaluation for Ischemia History:  No known CAD Cardiac Risk Factors: none  Symptoms:  Chest Pain (last date of chest discomfort was one week ago)   Nuclear Pre-Procedure Caffeine/Decaff Intake:  None NPO After: 8:30pm   Lungs:  clear O2 Sat: 99% on room air. IV 0.9% NS with Angio Cath:  22g  IV Site: R Hand  IV Started by:  Cathlyn Parsonsynthia Hasspacher, RN  Chest Size (in):  42 Cup Size: n/a  Height: 5\' 10"  (1.778 m)  Weight:  211 lb (95.709 kg)  BMI:  Body mass index is 30.28 kg/(m^2). Tech Comments:  n/a    Nuclear Med Study 1 or 2 day study: 1 day  Stress Test Type:  Stress  Reading MD: n/a  Order Authorizing Provider:  Gloris Manchesterraci Turner,MD  Resting Radionuclide: Technetium 1938m Sestamibi  Resting Radionuclide Dose: 11.0 mCi   Stress Radionuclide:  Technetium 5038m Sestamibi  Stress Radionuclide Dose: 33.0 mCi           Stress Protocol Rest HR: 68 Stress HR: 169  Rest BP: 131/76 Stress BP: 183/85  Exercise Time (min): 9:00 METS: 10.1           Dose of Adenosine (mg):  n/a Dose of Lexiscan: n/a mg  Dose of Atropine (mg): n/a Dose of Dobutamine: n/a mcg/kg/min (at max HR)  Stress Test Technologist: Nelson ChimesSharon Brooks, BS-ES  Nuclear Technologist:  Harlow AsaElizabeth Young, CNMT     Rest Procedure:  Myocardial perfusion imaging was performed at rest 45 minutes following the intravenous administration of Technetium 8538m Sestamibi. Rest ECG: NSR - Normal EKG  Stress Procedure:  The patient exercised on the treadmill utilizing the Bruce Protocol for 9:00 minutes. The patient stopped due to fatigue and denied any chest pain.  Technetium 6438m Sestamibi was injected at peak exercise and myocardial perfusion imaging  was performed after a brief delay. Stress ECG: No significant change from baseline ECG  QPS Raw Data Images:  Normal; no motion artifact; normal heart/lung ratio. Stress Images:  There is mild apical thinning with normal uptake in other regions. Rest Images:  There is mild apical thinning with normal uptake in other regions. Subtraction (SDS):  No evidence of ischemia. Transient Ischemic Dilatation (Normal <1.22):  0.89 Lung/Heart Ratio (Normal <0.45):  0.30  Quantitative Gated Spect Images QGS EDV:  116 ml QGS ESV:  53 ml  Impression Exercise Capacity:  Good exercise capacity. BP Response:  Normal blood pressure response. Clinical Symptoms:  No significant symptoms noted. ECG Impression:  No significant ST segment change suggestive of ischemia. Comparison with Prior Nuclear Study: No previous nuclear study performed  Overall Impression:  Low risk stress nuclear study with no suggestion of ischemia..  LV Ejection Fraction: 54%.  LV Wall Motion:  NL LV Function; NL Wall Motion  Donato SchultzSKAINS, MARK, MD

## 2014-06-02 ENCOUNTER — Encounter (INDEPENDENT_AMBULATORY_CARE_PROVIDER_SITE_OTHER): Payer: Self-pay | Admitting: Surgery

## 2014-06-02 ENCOUNTER — Ambulatory Visit (INDEPENDENT_AMBULATORY_CARE_PROVIDER_SITE_OTHER): Payer: BC Managed Care – PPO | Admitting: Surgery

## 2014-06-02 VITALS — BP 106/62 | HR 90 | Temp 98.1°F | Ht 70.0 in | Wt 211.2 lb

## 2014-06-02 DIAGNOSIS — Z9889 Other specified postprocedural states: Principal | ICD-10-CM

## 2014-06-02 DIAGNOSIS — K5732 Diverticulitis of large intestine without perforation or abscess without bleeding: Secondary | ICD-10-CM | POA: Insufficient documentation

## 2014-06-02 DIAGNOSIS — Z8719 Personal history of other diseases of the digestive system: Secondary | ICD-10-CM | POA: Insufficient documentation

## 2014-06-02 NOTE — Patient Instructions (Signed)
Barium Enema  A barium enema has been ordered for you by your caregiver to evaluate your colon. This test helps to evaluate the large bowel and rectum for problems. This may be used to search for polyps and lumps (tumors) or other diseases which could affect health.  LET YOUR CAREGIVER KNOW ABOUT:  · Allergies.  · Medications taken including herbs, eye drops, over-the-counter medications, and creams.  · Use of steroids (by mouth or creams).  · Previous problems with anesthetics.  · Possibility of pregnancy, if this applies.  · History of blood clots (thrombophlebitis).  · History of bleeding or blood problems.  · Previous surgery.  · Previous or existing colon problems.  · Other health problems.  Let your technologist know if you were unable to complete the preparations for your test or unable to follow the dietary instructions.  BEFORE THE PROCEDURE  Follow your instructions for test preparation so it will not need rescheduling. Try to follow instructions carefully as this will clean out your colon and improve the quality of your X-rays. A liquid diet may be required for a few days before testing. No food is allowed the night before the test. Suppositories, laxatives, or an enema may be required before testing. This preparation is to empty your colon before the test. Arrive here 60 minutes before check-in or as directed. The actual test takes about an hour.  PROCEDURE  A small tube is inserted into your rectum. This has a tip on the end with a balloon that can be inflated. The balloon is used to help you retain the barium that is put into your colon prior to the X-rays. Barium is a white chalky substance that helps outline the inside of your colon on an X-ray. You may feel the need to go to the bathroom; however the balloon will help prevent this while X-rays are being taken. During your exam you may be told to shift position and to hold your breath briefly. Some gentle pressure may be applied to your belly  (abdomen). All of this is done to obtain better X-rays. Following the X-rays you will be allowed to go the bathroom. Sometimes air may be put into the colon through the tube to obtain even better X-rays if necessary. These are called air contrast studies. A final X-ray may then be taken. You may leave when the technologist informs you that you are through.  For your comfort during the test:  · Relax as much as possible during the test.  · Try to follow your technologist's instructions to speed up the test and make it more effective.  AFTER THE PROCEDURE  · You may resume normal activities.  · Call for your X-ray results as instructed by your caregiver or technologist.  · Call your caregiver as instructed.  · Drink plenty of fluids and eat enough fruits and vegetables to keep your stools soft. The stools may appear lighter for a few days following the test. This is from the barium being passed in the stool.  SEEK IMMEDIATE MEDICAL CARE IF:  · You become lightheaded or faint.  · You pass blood from the rectum.  · You develop abdominal pain.  Document Released: 09/27/2000 Document Revised: 01/25/2013 Document Reviewed: 04/28/2009  ExitCare® Patient Information ©2015 [employer]. This information is not intended to replace advice given to you by your health care provider. Make sure you discuss any questions you have with your health care provider.

## 2014-06-02 NOTE — Progress Notes (Signed)
Chief Complaint:  Recurrent diverticulitis  History of Present Illness:  Timothy Stout is an 43 y.o. male who I saw back in 2010 with left-sided abdominal pain and he thought that it might be a recurrent or new left inguinal hernia. I had done a right inguinal hernia on him in 1993. He had a CT scan at that time that showed diverticulitis. 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 He had one this year that required a round of Cipro Flagyl and then a round of Augmentin.  We discussed his diverticulitis and now like to get a barium enema with contrast to see the spread of the tics and see if there in the descending colon sigmoid colon were scattered throughout the colon. I'll see him back in the office after the barium enema. He has had a colonoscopy about 5 years ago.  Past Medical History  Diagnosis Date  . Rotator cuff tear, right   . Migraine   . Diverticulitis     Past Surgical History  Procedure Laterality Date  . Knee arthroscopy  09-06-2009    LEFT KNEE  . Lumbar laminectomy/decompression microdiscectomy  02-17-2001    L5 - S1  . Inguinal hernia repair  1993  . Right knee arthroscopy  1999  . Shoulder arthroscopy with labral repair  08/19/2012    Procedure: SHOULDER ARTHROSCOPY WITH LABRAL REPAIR;  Surgeon: Drucilla SchmidtJames P Aplington, MD;  Location: [facility];  Service: Orthopedics;  Laterality: Right;  shaving of rotaor cuff  . Shoulder arthroscopy with subacromial decompression  08/19/2012    Procedure: SHOULDER ARTHROSCOPY WITH SUBACROMIAL DECOMPRESSION;  Surgeon: Drucilla SchmidtJames P Aplington, MD;  Location:  SURGERY CENTER;  Service: Orthopedics;  Laterality: Right;    Current Outpatient Prescriptions  Medication Sig Dispense Refill  . aspirin 81 MG tablet Take 81 mg by mouth daily.      . magnesium oxide (MAG-OX) 400 MG tablet Take 400 mg by mouth daily.      . rizatriptan (MAXALT) 10 MG  tablet Take 10 mg by mouth as needed. May repeat in 2 hours if needed       No current facility-administered medications for this visit.   Review of patient's allergies indicates no known allergies. Family History  Problem Relation Age of Onset  . Lupus Mother   . Alzheimer's disease Father   . Prostate cancer Father    Social History:   reports that he has never smoked. He has never used smokeless tobacco. He reports that he drinks alcohol. He reports that he does not use illicit drugs.   REVIEW OF SYSTEMS : Negative except for as mentioned above  Physical Exam:   Blood pressure 106/62, pulse 90, temperature 98.1 F (36.7 C), temperature source Oral, height 5\' 10"  (1.778 m), weight 211 lb 4 oz (95.822 kg). Body mass index is 30.31 kg/(m^2).  Gen:  WDWN white male NAD  Neurological: Alert and oriented to person, place, and time. Motor and sensory function is grossly intact  Head: Normocephalic and atraumatic.  Eyes: Conjunctivae are normal. Pupils are equal, round, and reactive to light. No scleral icterus.  Neck: Normal range of motion. Neck supple. No tracheal deviation or thyromegaly present.  Cardiovascular:  SR without murmurs or gallops.  No carotid bruits Breast:  Not examined Respiratory: Effort normal.  No respiratory distress. No chest wall tenderness. Breath sounds normal.  No wheezes, rales or rhonchi.  Abdomen:  No tenderness at present. GU:  Prior right inguinal hernia Musculoskeletal: Normal range of motion. Extremities are nontender. No cyanosis, edema or clubbing noted Lymphadenopathy: No cervical, preauricular, postauricular or axillary adenopathy is present Skin: Skin is warm and dry. No rash noted. No diaphoresis. No erythema. No pallor. Pscyh: Normal mood and affect. Behavior is normal. Judgment and thought content normal.   LABORATORY RESULTS: No results found for this or any previous visit (from the past 48 hour(s)).   RADIOLOGY RESULTS: No results  found.  Problem List: Patient Active Problem List   Diagnosis Date Noted  . H/O right inguinal hernia repair 1993 06/02/2014  . Diverticulitis of colon  06/02/2014  . Chest pain 03/01/2014    Assessment & Plan: History of recurrent diverticulitis. Plan air contrast barium enema with followup to discuss long-term management strategies. I think it would try to treat him during his high risk. With MiraLAX and dietary changes to avoid constipation during this time of stress.    Matt B. Daphine Deutscher, MD, [facility] [phone] beeper [phone]  06/02/2014 10:46 AM    .

## 2014-06-08 ENCOUNTER — Other Ambulatory Visit: Payer: BC Managed Care – PPO

## 2014-06-13 ENCOUNTER — Other Ambulatory Visit: Payer: BC Managed Care – PPO

## 2014-06-21 ENCOUNTER — Other Ambulatory Visit: Payer: BC Managed Care – PPO

## 2014-06-24 ENCOUNTER — Ambulatory Visit
Admission: RE | Admit: 2014-06-24 | Discharge: 2014-06-24 | Disposition: A | Discharge status: Home / Self Care | Payer: BC Managed Care – PPO | Source: Ambulatory Visit | Attending: Surgery | Admitting: Surgery

## 2014-06-24 DIAGNOSIS — K5732 Diverticulitis of large intestine without perforation or abscess without bleeding: Secondary | ICD-10-CM

## 2017-04-25 DIAGNOSIS — Z0000 Encounter for general adult medical examination without abnormal findings: Secondary | ICD-10-CM | POA: Diagnosis not present

## 2017-07-13 DIAGNOSIS — K5792 Diverticulitis of intestine, part unspecified, without perforation or abscess without bleeding: Secondary | ICD-10-CM | POA: Diagnosis not present

## 2017-10-28 ENCOUNTER — Encounter (INDEPENDENT_AMBULATORY_CARE_PROVIDER_SITE_OTHER): Payer: Self-pay

## 2017-10-28 ENCOUNTER — Ambulatory Visit: Payer: BLUE CROSS/BLUE SHIELD | Admitting: Cardiovascular Disease

## 2017-10-28 ENCOUNTER — Encounter: Payer: Self-pay | Admitting: Cardiovascular Disease

## 2017-10-28 VITALS — BP 114/74 | HR 85 | Ht 71.0 in | Wt 219.8 lb

## 2017-10-28 DIAGNOSIS — E782 Mixed hyperlipidemia: Secondary | ICD-10-CM | POA: Diagnosis not present

## 2017-10-28 DIAGNOSIS — R0789 Other chest pain: Secondary | ICD-10-CM

## 2017-10-28 NOTE — Progress Notes (Signed)
Cardiology Office Note:    Date:  10/28/2017   ID:  Timothy Stout, DOB Apr 28, 1971, MRN [id number]  PCP:  Joycelyn RuaMeyers, Stephen, MD  Cardiologist:   Tully Mcinturff  Referring MD: Rosana BergerSteve Meyers, MD    Problem List 1. Atypical CP 2. Migraine headaches  3. Arthroscopic knee surgery .  4.  Obstructive sleep apnea - borderline    Chief Complaint  Patient presents with  . Chest Pain    History of Present Illness:    Timothy Nobleubrey Scott Joines is a 47 y.o. male with a hx of chest discomfort in the past.  We are asked to see him today by Dr. Royetta AsalSteve Myers for some recurrent episodes of chest discomfort.  Lorin PicketScott has had episodic chest pain for the past couple of years.  He has seen Dr. Mayford Knifeurner in the past.  He had a normal stress test.  He is continued to have intermittent episodes of chest discomfort.  These seem to be related to the foods that he might eat.  The pain seems to be located towards the right or towards the left side of his chest.     the CP is not exertional. Feels like MSK pain.   Has been back on the Elliptical and he is working out regularly without any CP.  Lifted weights without any issues. Last night, he worked out - HR of 171   , had no CP  No dyspnea, no syncope or presyncope. , no PND or orthopnea.   [employer] is [employer].     Past Medical History:  Diagnosis Date  . Abnormal LFTs   . Acute hepatitis   . Diverticulitis   . GERD (gastroesophageal reflux disease)   . Hyperlipidemia   . Migraine   . OSA (obstructive sleep apnea)    MILD  . Rotator cuff tear, right   . Thumb weakness    LEFT    Past Surgical History:  Procedure Laterality Date  . INGUINAL HERNIA REPAIR  1993  . KNEE ARTHROSCOPY  09-06-2009   LEFT KNEE  . LUMBAR LAMINECTOMY/DECOMPRESSION MICRODISCECTOMY  02-17-2001   L5 - S1  . RIGHT KNEE ARTHROSCOPY  1999  . SHOULDER ARTHROSCOPY WITH LABRAL REPAIR  08/19/2012   Procedure: SHOULDER ARTHROSCOPY WITH LABRAL REPAIR;  Surgeon: Drucilla SchmidtJames P Aplington,  MD;  Location: [facility];  Service: Orthopedics;  Laterality: Right;  shaving of rotaor cuff  . SHOULDER ARTHROSCOPY WITH SUBACROMIAL DECOMPRESSION  08/19/2012   Procedure: SHOULDER ARTHROSCOPY WITH SUBACROMIAL DECOMPRESSION;  Surgeon: Drucilla SchmidtJames P Aplington, MD;  Location: [facility];  Service: Orthopedics;  Laterality: Right;    Current Medications: Current Meds  Medication Sig  . aspirin 81 MG tablet Take 81 mg by mouth daily.  . magnesium oxide (MAG-OX) 400 MG tablet Take 400 mg by mouth daily.  . rizatriptan (MAXALT) 10 MG tablet Take 10 mg by mouth as needed for migraine. May repeat in 2 hours if needed      Allergies:   Patient has no known allergies.   Social History   Socioeconomic History  . Marital status: Married    Spouse name: None  . Number of children: None  . Years of education: None  . Highest education level: None  Social Needs  . Financial resource strain: None  . Food insecurity - worry: None  . Food insecurity - inability: None  . Transportation needs - medical: None  . Transportation needs - non-medical: None  Occupational History  . None  Tobacco Use  . Smoking status: Never Smoker  . Smokeless tobacco: Never Used  Substance and Sexual Activity  . Alcohol use: Yes    Comment: OCCASIONAL  . Drug use: No  . Sexual activity: None  Other Topics Concern  . None  Social History Narrative  . None     Family History: The patient's family history includes Alzheimer's disease in his father; COPD in his father; Cancer - Colon in his father; Diabetes in his father; Heart attack in his maternal grandmother; Heart failure in his mother; Lupus in his mother; Prostate cancer in his father.  ROS:   Please see the history of present illness.     All other systems reviewed and are negative.  EKGs/Labs/Other Studies Reviewed:    The following studies were reviewed today:   EKG:    Jan . 15, 2019:  NSR at 85.    RAD .     Recent  Labs: No results found for requested labs within last 8760 hours.  Recent Lipid Panel No results found for: CHOL, TRIG, HDL, CHOLHDL, VLDL, LDLCALC, LDLDIRECT  Physical Exam:    VS:  BP 114/74   Pulse 85   Ht 5\' 11"  (1.803 m)   Wt 219 lb 12.8 oz (99.7 kg)   SpO2 97%   BMI 30.66 kg/m     Wt Readings from Last 3 Encounters:  10/28/17 219 lb 12.8 oz (99.7 kg)  06/02/14 211 lb 4 oz (95.8 kg)  03/29/14 211 lb (95.7 kg)     GEN:  Well nourished, well developed in no acute distress HEENT: Normal NECK: No JVD; No carotid bruits LYMPHATICS: No lymphadenopathy CARDIAC:  RR s RESPIRATORY:  Clear to auscultation without rales, wheezing or rhonchi  ABDOMEN: Soft, non-tender, non-distended MUSCULOSKELETAL:  No edema; No deformity  SKIN: Warm and dry NEUROLOGIC:  Alert and oriented x 3 PSYCHIATRIC:  Normal affect   ASSESSMENT:    1. Atypical chest pain   2. Mixed hyperlipidemia    PLAN:    In order of problems listed above:  1. Atypical chest pain: Lorin Picket presents with some atypical episodes of chest pain.  These are located on either side of his chest.  They are not related to exercise.  They seem to be more musculoskeletal and are likely on the surface of his chest.  He works out on a regular basis and is not had any episodes of chest discomfort.  We will have him continue to exercise on a regular basis.  I will see him on an as-needed basis.  I be happy to see him if he has any episodes of chest discomfort that are related to exertion.  2.  Mild hyperlipidemia: His lipid levels are mildly elevated.  I have advised him to avoid excessive fats and carbohydrates.   Medication Adjustments/Labs and Tests Ordered: Current medicines are reviewed at length with the patient today.  Concerns regarding medicines are outlined above.  Orders Placed This Encounter  Procedures  . EKG 12-Lead   No orders of the defined types were placed in this encounter.   Signed, Kristeen Miss, MD   10/28/2017 1:57 PM    [facility]

## 2017-10-28 NOTE — Patient Instructions (Signed)

## 2017-12-07 DIAGNOSIS — K5792 Diverticulitis of intestine, part unspecified, without perforation or abscess without bleeding: Secondary | ICD-10-CM | POA: Diagnosis not present

## 2017-12-07 DIAGNOSIS — R197 Diarrhea, unspecified: Secondary | ICD-10-CM | POA: Diagnosis not present

## 2018-04-07 DIAGNOSIS — S61211A Laceration without foreign body of left index finger without damage to nail, initial encounter: Secondary | ICD-10-CM | POA: Diagnosis not present

## 2018-07-17 DIAGNOSIS — Z0000 Encounter for general adult medical examination without abnormal findings: Secondary | ICD-10-CM | POA: Diagnosis not present

## 2018-07-17 DIAGNOSIS — Z136 Encounter for screening for cardiovascular disorders: Secondary | ICD-10-CM | POA: Diagnosis not present

## 2018-07-17 DIAGNOSIS — Z131 Encounter for screening for diabetes mellitus: Secondary | ICD-10-CM | POA: Diagnosis not present

## 2018-08-12 DIAGNOSIS — R1030 Lower abdominal pain, unspecified: Secondary | ICD-10-CM | POA: Diagnosis not present

## 2018-09-08 DIAGNOSIS — J209 Acute bronchitis, unspecified: Secondary | ICD-10-CM | POA: Diagnosis not present

## 2018-11-27 DIAGNOSIS — K5792 Diverticulitis of intestine, part unspecified, without perforation or abscess without bleeding: Secondary | ICD-10-CM | POA: Diagnosis not present

## 2019-06-03 DIAGNOSIS — H25812 Combined forms of age-related cataract, left eye: Secondary | ICD-10-CM | POA: Diagnosis not present

## 2019-06-03 DIAGNOSIS — Z9889 Other specified postprocedural states: Secondary | ICD-10-CM | POA: Diagnosis not present

## 2019-07-20 DIAGNOSIS — Z131 Encounter for screening for diabetes mellitus: Secondary | ICD-10-CM | POA: Diagnosis not present

## 2019-07-20 DIAGNOSIS — Z0000 Encounter for general adult medical examination without abnormal findings: Secondary | ICD-10-CM | POA: Diagnosis not present

## 2019-07-20 DIAGNOSIS — Z23 Encounter for immunization: Secondary | ICD-10-CM | POA: Diagnosis not present

## 2019-07-20 DIAGNOSIS — Z13220 Encounter for screening for lipoid disorders: Secondary | ICD-10-CM | POA: Diagnosis not present

## 2019-10-27 ENCOUNTER — Telehealth (INDEPENDENT_AMBULATORY_CARE_PROVIDER_SITE_OTHER): Payer: BC Managed Care – PPO | Admitting: Cardiology

## 2019-10-27 ENCOUNTER — Telehealth: Payer: Self-pay

## 2019-10-27 ENCOUNTER — Other Ambulatory Visit: Payer: Self-pay

## 2019-10-27 ENCOUNTER — Encounter: Payer: Self-pay | Admitting: Cardiology

## 2019-10-27 VITALS — BP 114/83 | HR 78 | Ht 71.0 in

## 2019-10-27 DIAGNOSIS — E782 Mixed hyperlipidemia: Secondary | ICD-10-CM

## 2019-10-27 DIAGNOSIS — R0789 Other chest pain: Secondary | ICD-10-CM

## 2019-10-27 NOTE — Patient Instructions (Addendum)
Medication Instructions:  Your physician recommends that you continue on your current medications as directed. Please refer to the Current Medication list given to you today.  *If you need a refill on your cardiac medications before your next appointment, please call your pharmacy*  Lab Work: None ordered  If you have labs (blood work) drawn today and your tests are completely normal, you will receive your results only by: Marland Kitchen MyChart Message (if you have MyChart) OR . A paper copy in the mail If you have any lab test that is abnormal or we need to change your treatment, we will call you to review the results.  Testing/Procedures: Your provider recommends you get a CT Calcium Scoring Test.  Someone will call you to arrange.  Follow-Up: At [facility], you and your health needs are our priority.  As part of our continuing mission to provide you with exceptional heart care, we have created designated Provider Care Teams.  These Care Teams include your primary Cardiologist (physician) and Advanced Practice Providers (APPs -  Physician Assistants and Nurse Practitioners) who all work together to provide you with the care you need, when you need it.  Your next appointment:   Will depend upon your test.  We will call you   Lifestyle Modifications to Prevent and Treat Heart Disease -Recommend heart healthy/Mediterranean diet, with whole grains, fruits, vegetables, fish, lean meats, nuts, olive oil and avocado oil.  -Limit salt intake to less than 2000 mg per day.  -Recommend moderate walking, starting slowly with a few minutes and working up to 3-5 times/week for 30-50 minutes each session. Aim for at least 150 minutes.week. Goal should be pace of 3 miles/hours, or walking 1.5 miles in 30 minutes -Recommend avoidance of tobacco products. Avoid excess alcohol. -Keep blood pressure well controlled, ideally less than 130/80.   ==========================  Mediterranean Diet A Mediterranean  diet refers to food and lifestyle choices that are based on the traditions of countries located on the [employer] [place]. This way of eating has been shown to help prevent certain conditions and improve outcomes for people who have chronic diseases, like kidney disease and heart disease. What are tips for following this plan? Lifestyle  Cook and eat meals together with your family, when possible.  Drink enough fluid to keep your urine clear or pale yellow.  Be physically active every day. This includes: ? Aerobic exercise like running or swimming. ? Leisure activities like gardening, walking, or housework.  Get 7-8 hours of sleep each night.  If recommended by your health care provider, drink red wine in moderation. This means 1 glass a day for nonpregnant women and 2 glasses a day for men. A glass of wine equals 5 oz (150 mL). Reading food labels   Check the serving size of packaged foods. For foods such as rice and pasta, the serving size refers to the amount of cooked product, not dry.  Check the total fat in packaged foods. Avoid foods that have saturated fat or trans fats.  Check the ingredients list for added sugars, such as corn syrup. Shopping  At the grocery store, buy most of your food from the areas near the walls of the store. This includes: ? Fresh fruits and vegetables (produce). ? Grains, beans, nuts, and seeds. Some of these may be available in unpackaged forms or large amounts (in bulk). ? Fresh seafood. ? Poultry and eggs. ? Low-fat dairy products.  Buy whole ingredients instead of prepackaged foods.  Buy fresh fruits  and vegetables in-season from local farmers markets.  Buy frozen fruits and vegetables in resealable bags.  If you do not have access to quality fresh seafood, buy precooked frozen shrimp or canned fish, such as tuna, salmon, or sardines.  Buy small amounts of raw or cooked vegetables, salads, or olives from the deli or salad bar at your  store.  Stock your pantry so you always have certain foods on hand, such as olive oil, canned tuna, canned tomatoes, rice, pasta, and beans. Cooking  Cook foods with extra-virgin olive oil instead of using butter or other vegetable oils.  Have meat as a side dish, and have vegetables or grains as your main dish. This means having meat in small portions or adding small amounts of meat to foods like pasta or stew.  Use beans or vegetables instead of meat in common dishes like chili or lasagna.  Experiment with different cooking methods. Try roasting or broiling vegetables instead of steaming or sauteing them.  Add frozen vegetables to soups, stews, pasta, or rice.  Add nuts or seeds for added healthy fat at each meal. You can add these to yogurt, salads, or vegetable dishes.  Marinate fish or vegetables using olive oil, lemon juice, garlic, and fresh herbs. Meal planning   Plan to eat 1 vegetarian meal one day each week. Try to work up to 2 vegetarian meals, if possible.  Eat seafood 2 or more times a week.  Have healthy snacks readily available, such as: ? Vegetable sticks with hummus. ? [place] yogurt. ? Fruit and nut trail mix.  Eat balanced meals throughout the week. This includes: ? Fruit: 2-3 servings a day ? Vegetables: 4-5 servings a day ? Low-fat dairy: 2 servings a day ? Fish, poultry, or lean meat: 1 serving a day ? Beans and legumes: 2 or more servings a week ? Nuts and seeds: 1-2 servings a day ? Whole grains: 6-8 servings a day ? Extra-virgin olive oil: 3-4 servings a day  Limit red meat and sweets to only a few servings a month What are my food choices?  Mediterranean diet ? Recommended  Grains: Whole-grain pasta. Brown rice. Bulgar wheat. Polenta. Couscous. Whole-wheat bread. [place].  Vegetables: Artichokes. Beets. Broccoli. Cabbage. Carrots. Eggplant. Green beans. Chard. Kale. Spinach. Onions. Leeks. Peas. Squash. Tomatoes. Peppers.  Radishes.  Fruits: Apples. Apricots. Avocado. Berries. Bananas. Cherries. Dates. Figs. Grapes. Lemons. Melon. Oranges. Peaches. Plums. Pomegranate.  Meats and other protein foods: Beans. Almonds. Sunflower seeds. Pine nuts. Peanuts. [place]. Salmon. Scallops. Shrimp. [place]. Tilapia. Clams. Oysters. Eggs.  Dairy: Low-fat milk. Cheese. Greek yogurt.  Beverages: Water. Red wine. Herbal tea.  Fats and oils: Extra virgin olive oil. Avocado oil. Grape seed oil.  Sweets and desserts: [place] yogurt with honey. Baked apples. Poached pears. Trail mix.  Seasoning and other foods: Basil. Cilantro. Coriander. Cumin. Mint. Parsley. Sage. Rosemary. Tarragon. Garlic. Oregano. Thyme. Pepper. Balsalmic vinegar. Tahini. Hummus. Tomato sauce. Olives. Mushrooms. ? Limit these  Grains: Prepackaged pasta or rice dishes. Prepackaged cereal with added sugar.  Vegetables: Deep fried potatoes (french fries).  Fruits: Fruit canned in syrup.  Meats and other protein foods: Beef. Pork. Lamb. Poultry with skin. Hot dogs. [place] Salines.  Dairy: Ice cream. Sour cream. Whole milk.  Beverages: Juice. Sugar-sweetened soft drinks. Beer. Liquor and spirits.  Fats and oils: Butter. Canola oil. Vegetable oil. Beef fat (tallow). Lard.  Sweets and desserts: Cookies. Cakes. Pies. Candy.  Seasoning and other foods: Mayonnaise. Premade sauces and marinades. The items listed  may not be a complete list. Talk with your dietitian about what dietary choices are right for you. Summary  The Mediterranean diet includes both food and lifestyle choices.  Eat a variety of fresh fruits and vegetables, beans, nuts, seeds, and whole grains.  Limit the amount of red meat and sweets that you eat.  Talk with your health care provider about whether it is safe for you to drink red wine in moderation. This means 1 glass a day for nonpregnant women and 2 glasses a day for men. A glass of wine equals 5 oz (150 mL). This information is not intended to  replace advice given to you by your health care provider. Make sure you discuss any questions you have with your health care provider. Document Revised: 05/30/2016 Document Reviewed: 05/23/2016 Elsevier Patient Education  2020 [employer].   ================================= Coronary Calcium Scan A coronary calcium scan is an imaging test used to look for deposits of plaque in the inner lining of the blood vessels of the heart (coronary arteries). Plaque is made up of calcium, protein, and fatty substances. These deposits of plaque can partly clog and narrow the coronary arteries without producing any symptoms or warning signs. This puts a person at risk for a heart attack. This test is recommended for people who are at moderate risk for heart disease. The test can find plaque deposits before symptoms develop. Tell a health care provider about:  Any allergies you have.  All medicines you are taking, including vitamins, herbs, eye drops, creams, and over-the-counter medicines.  Any problems you or family members have had with anesthetic medicines.  Any blood disorders you have.  Any surgeries you have had.  Any medical conditions you have.  Whether you are pregnant or may be pregnant. What are the risks? Generally, this is a safe procedure. However, problems may occur, including:  Harm to a pregnant woman and her unborn baby. This test involves the use of radiation. Radiation exposure can be dangerous to a pregnant woman and her unborn baby. If you are pregnant or think you may be pregnant, you should not have this procedure done.  Slight increase in the risk of cancer. This is because of the radiation involved in the test. What happens before the procedure? Ask your health care provider for any specific instructions on how to prepare for this procedure. You may be asked to avoid products that contain caffeine, tobacco, or nicotine for 4 hours before the procedure. What happens  during the procedure?   You will undress and remove any jewelry from your neck or chest.  You will put on a hospital gown.  Sticky electrodes will be placed on your chest. The electrodes will be connected to an electrocardiogram (ECG) machine to record a tracing of the electrical activity of your heart.  You will lie down on a curved bed that is attached to the CT scanner.  You may be given medicine to slow down your heart rate so that clear pictures can be created.  You will be moved into the CT scanner, and the CT scanner will take pictures of your heart. During this time, you will be asked to lie still and hold your breath for 2-3 seconds at a time while each picture of your heart is being taken. The procedure may vary among health care providers and hospitals. What happens after the procedure?  You can get dressed.  You can return to your normal activities.  It is up to you  to get the results of your procedure. Ask your health care provider, or the department that is doing the procedure, when your results will be ready. Summary  A coronary calcium scan is an imaging test used to look for deposits of plaque in the inner lining of the blood vessels of the heart (coronary arteries). Plaque is made up of calcium, protein, and fatty substances.  Generally, this is a safe procedure. Tell your health care provider if you are pregnant or may be pregnant.  Ask your health care provider for any specific instructions on how to prepare for this procedure.  A CT scanner will take pictures of your heart.  You can return to your normal activities after the scan is done. This information is not intended to replace advice given to you by your health care provider. Make sure you discuss any questions you have with your health care provider. Document Revised: 04/20/2019 Document Reviewed: 04/20/2019 Elsevier Patient Education  2020 [employer].

## 2019-10-27 NOTE — Progress Notes (Signed)
Virtual Visit via Video Note   This visit type was conducted due to national recommendations for restrictions regarding the COVID-19 Pandemic (e.g. social distancing) in an effort to limit this patient's exposure and mitigate transmission in our community.  Due to his co-morbid illnesses, this patient is at least at moderate risk for complications without adequate follow up.  This format is felt to be most appropriate for this patient at this time.  All issues noted in this document were discussed and addressed.  A limited physical exam was performed with this format.  Please refer to the patient's chart for his consent to telehealth for [facility].   Date:  10/27/2019   ID:  Timothy Stout, DOB 1971-07-17, MRN [id number]  Patient Location: Home Provider Location: Home  PCP:  Joycelyn Rua, MD  Cardiologist:  Kristeen Miss, MD  Electrophysiologist:  None   Evaluation Performed:  Follow-Up Visit  Chief Complaint:  Chest pain  History of Present Illness:    Timothy Stout is a 49 y.o. male with history of atypical chest pain, hyperlipidemia, GERD, abnormal LFTs, migraine headache, obstructive sleep apnea-borderline.  The patient has a history of chest discomfort in the past, not related to activity.  This was felt to be possibly related to musculoskeletal issues versus GERD.  He has had a normal stress test in 2015.  The patient was being followed on an as-needed basis.  Mr. Starace requested to be seen today to discuss chest pain. He gets heart burn occasionally, burning and acid reflux. Occurs more often when he eats something with tomato sauce. Once in a while he does get some left sided chest pain, feels to him like the muscles in the ribcage, more superficial. No substernal chest pressure. Recently he has had some occ dizziness and unassociated left arm tingling like when he had tennis elbow in the past.   He lives with his wife who wanted to him to get checked  out. He works as an Airline pilot, getting ready for tax season. He mostly just walks around the office. He has recently started back on his elliptical for about 30-40 minutes about once a week. He has no chest discomfort or significant shortness of breath. He is able to carry on a conversation during exercise. He slows down if his HR goes over 150 bpm. He denies orthopnea, PND, edema, palpitations,  or syncope.   He is a non-smoker. He drinks 1-2 glasses of wine or beer once or twice a week. Has no real family history of CAD.   The patient does not have symptoms concerning for COVID-19 infection (fever, chills, cough, or new shortness of breath).  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  He has not had any symptoms himself.  He is going for a COVID-19 test tomorrow.   Past Medical History:  Diagnosis Date  . Abnormal LFTs   . Acute hepatitis   . Diverticulitis   . GERD (gastroesophageal reflux disease)   . Hyperlipidemia   . Migraine   . OSA (obstructive sleep apnea)    MILD  . Rotator cuff tear, right   . Thumb weakness    LEFT   Past Surgical History:  Procedure Laterality Date  . INGUINAL HERNIA REPAIR  1993  . KNEE ARTHROSCOPY  09-06-2009   LEFT KNEE  . LUMBAR LAMINECTOMY/DECOMPRESSION MICRODISCECTOMY  02-17-2001   L5 - S1  . RIGHT KNEE ARTHROSCOPY  1999  .  SHOULDER ARTHROSCOPY WITH LABRAL REPAIR  08/19/2012   Procedure: SHOULDER ARTHROSCOPY WITH LABRAL REPAIR;  Surgeon: Magnus Sinning, MD;  Location: [facility];  Service: Orthopedics;  Laterality: Right;  shaving of rotaor cuff  . SHOULDER ARTHROSCOPY WITH SUBACROMIAL DECOMPRESSION  08/19/2012   Procedure: SHOULDER ARTHROSCOPY WITH SUBACROMIAL DECOMPRESSION;  Surgeon: Magnus Sinning, MD;  Location: [facility];  Service: Orthopedics;  Laterality: Right;     Current Meds  Medication Sig  . aspirin 81 MG tablet Take 81 mg by mouth daily.  .  magnesium oxide (MAG-OX) 400 MG tablet Take 400 mg by mouth daily.  . Multiple Vitamin (MULTIVITAMIN ADULT PO) Take 1 tablet by mouth daily.  . rizatriptan (MAXALT) 10 MG tablet Take 10 mg by mouth as needed for migraine. May repeat in 2 hours if needed      Allergies:   Patient has no known allergies.   Social History   Tobacco Use  . Smoking status: Never Smoker  . Smokeless tobacco: Never Used  Substance Use Topics  . Alcohol use: Yes    Comment: OCCASIONAL  . Drug use: No     Family Hx: The patient's family history includes Alzheimer's disease in his father; COPD in his father; Cancer - Colon in his father; Diabetes in his father; Heart attack in his maternal grandmother; Heart failure in his mother; Lupus in his mother; Prostate cancer in his father.  ROS:   Please see the history of present illness.     All other systems reviewed and are negative.   Prior CV studies:   The following studies were reviewed today:  Normal stress test in 2015  Labs/Other Tests and Data Reviewed:    EKG:  An ECG dated 10/28/2017 was personally reviewed today and demonstrated:  NSR, RAD, 85pm  Recent Labs: No results found for requested labs within last 8760 hours.   Recent Lipid Panel No results found for: CHOL, TRIG, HDL, CHOLHDL, LDLCALC, LDLDIRECT  Wt Readings from Last 3 Encounters:  10/28/17 219 lb 12.8 oz (99.7 kg)  06/02/14 211 lb 4 oz (95.8 kg)  03/29/14 211 lb (95.7 kg)    Per [facility]   Objective:    Vital Signs:  BP 114/83   Pulse 78   Ht 5\' 11"  (1.803 m)   BMI 30.66 kg/m    VITAL SIGNS:  reviewed GEN:  no acute distress EYES:  sclerae anicteric, EOMI - Extraocular Movements Intact RESPIRATORY:  normal respiratory effort, symmetric expansion CARDIOVASCULAR:  no peripheral edema NEURO:  alert and oriented x 3, no obvious focal deficit PSYCH:  normal affect  ASSESSMENT & PLAN:    Chest pain -The patient's chest discomfort is very atypical, sometimes related to  reflux and sometimes musculoskeletal.  He is able to exercise without any exertional chest discomfort or shortness of breath. -I considered a stress test, however patient is not having any anginal type chest discomfort. -ED risk factors include hyperlipidemia and obesity. -With his elevated cholesterol, I recommended a coronary CT scan for calcium scoring for risk stratification.  The patient is in agreement and understands that this will be an out-of-pocket test.  We discussed that if he has significant plaque, I would initiate statin therapy.  -Further testing could possibly be needed if his calcium score is very high. -We discussed risk factor modification with exercise and heart healthy diet.  We discussed the Mediterranean diet in detail and I have provided written information.  Hyperlipidemia -This has been  mild in the past and patient was suggested on lifestyle modification. [facility].  LDL 121. -As above we will check a coronary calcium score to evaluate for coronary plaque.  If plaque is present we will initiate statin therapy.  COVID-19 Education: The signs and symptoms of COVID-19 were discussed with the patient and how to seek care for testing (follow up with PCP or arrange E-visit).  The importance of social distancing was discussed today.  Time:   Today, I have spent 32 minutes with the patient with telehealth technology discussing the above problems.     Medication Adjustments/Labs and Tests Ordered: Current medicines are reviewed at length with the patient today.  Concerns regarding medicines are outlined above.   Tests Ordered: Orders Placed This Encounter  Procedures  . CT CARDIAC SCORING    Medication Changes: No orders of the defined types were placed in this encounter.   Follow Up:  Either In Person or Virtual prn  Signed, Berton Bon, NP  10/27/2019 3:04 PM    [facility]

## 2019-10-27 NOTE — Telephone Encounter (Signed)
Pt has given verbal consent for video visit.   YOUR CARDIOLOGY TEAM HAS ARRANGED FOR AN E-VISIT FOR YOUR APPOINTMENT - PLEASE REVIEW IMPORTANT INFORMATION BELOW SEVERAL DAYS PRIOR TO YOUR APPOINTMENT  Due to the recent COVID-19 pandemic, we are transitioning in-person office visits to tele-medicine visits in an effort to decrease unnecessary exposure to our patients, their families, and staff. These visits are billed to your insurance just like a normal visit is. We also encourage you to sign up for MyChart if you have not already done so. You will need a smartphone if possible. For patients that do not have this, we can still complete the visit using a regular telephone but do prefer a smartphone to enable video when possible. You may have a family member that lives with you that can help. If possible, we also ask that you have a blood pressure cuff and scale at home to measure your blood pressure, heart rate and weight prior to your scheduled appointment. Patients with clinical needs that need an in-person evaluation and testing will still be able to come to the office if absolutely necessary. If you have any questions, feel free to call our office.     YOUR PROVIDER WILL BE USING THE FOLLOWING PLATFORM TO COMPLETE YOUR VISIT: Doximity . IF USING MYCHART - How to Download the MyChart App to Your SmartPhone   - If Apple, go to [employer] and type in MyChart in the search bar and download the app. If Android, ask patient to go to [employer] and type in [place] in the search bar and download the app. The app is free but as with any other app downloads, your phone may require you to verify saved payment information or Apple/Android password.  - You will need to then log into the app with your MyChart username and password, and select [facility] as your healthcare provider to link the account.  - When it is time for your visit, go to the MyChart app, find appointments, and click Begin Video Visit.  Be sure to Select Allow for your device to access the Microphone and Camera for your visit. You will then be connected, and your provider will be with you shortly.  **If you have any issues connecting or need assistance, please contact MyChart service desk [phone])**  **If using a computer, in order to ensure the best quality for your visit, you will need to use either of the following Internet Browsers: [employer] or [employer]**  . IF USING DOXIMITY or DOXY.ME - The staff will give you instructions on receiving your link to join the meeting the day of your visit.      2-3 DAYS BEFORE YOUR APPOINTMENT  You will receive a telephone call from one of our [facility] team members - your caller ID may say "Unknown caller." If this is a video visit, we will walk you through how to get the video launched on your phone. We will remind you check your blood pressure, heart rate and weight prior to your scheduled appointment. If you have an Apple Watch or Kardia, please upload any pertinent ECG strips the day before or morning of your appointment to MyChart. Our staff will also make sure you have reviewed the consent and agree to move forward with your scheduled tele-health visit.     THE DAY OF YOUR APPOINTMENT  Approximately 15 minutes prior to your scheduled appointment, you will receive a telephone call from one of HeartCare team -  your caller ID may say "Unknown caller."  Our staff will confirm medications, vital signs for the day and any symptoms you may be experiencing. Please have this information available prior to the time of visit start. It may also be helpful for you to have a pad of paper and pen handy for any instructions given during your visit. They will also walk you through joining the smartphone meeting if this is a video visit.    CONSENT FOR TELE-HEALTH VISIT - PLEASE REVIEW  I hereby voluntarily request, consent and authorize [facility] and its employed  or contracted physicians, physician assistants, nurse practitioners or other licensed health care professionals (the Practitioner), to provide me with telemedicine health care services (the "Services") as deemed necessary by the treating Practitioner. I acknowledge and consent to receive the Services by the Practitioner via telemedicine. I understand that the telemedicine visit will involve communicating with the Practitioner through live audiovisual communication technology and the disclosure of certain medical information by electronic transmission. I acknowledge that I have been given the opportunity to request an in-person assessment or other available alternative prior to the telemedicine visit and am voluntarily participating in the telemedicine visit.  I understand that I have the right to withhold or withdraw my consent to the use of telemedicine in the course of my care at any time, without affecting my right to future care or treatment, and that the Practitioner or I may terminate the telemedicine visit at any time. I understand that I have the right to inspect all information obtained and/or recorded in the course of the telemedicine visit and may receive copies of available information for a reasonable fee.  I understand that some of the potential risks of receiving the Services via telemedicine include:  Marland Kitchen Delay or interruption in medical evaluation due to technological equipment failure or disruption; . Information transmitted may not be sufficient (e.g. poor resolution of images) to allow for appropriate medical decision making by the Practitioner; and/or  . In rare instances, security protocols could fail, causing a breach of personal health information.  Furthermore, I acknowledge that it is my responsibility to provide information about my medical history, conditions and care that is complete and accurate to the best of my ability. I acknowledge that Practitioner's advice, recommendations,  and/or decision may be based on factors not within their control, such as incomplete or inaccurate data provided by me or distortions of diagnostic images or specimens that may result from electronic transmissions. I understand that the practice of medicine is not an exact science and that Practitioner makes no warranties or guarantees regarding treatment outcomes. I acknowledge that I will receive a copy of this consent concurrently upon execution via email to the email address I last provided but may also request a printed copy by calling the office of [place].    I understand that my insurance will be billed for this visit.   I have read or had this consent read to me. . I understand the contents of this consent, which adequately explains the benefits and risks of the Services being provided via telemedicine.  . I have been provided ample opportunity to ask questions regarding this consent and the Services and have had my questions answered to my satisfaction. . I give my informed consent for the services to be provided through the use of telemedicine in my medical care  By participating in this telemedicine visit I agree to the above.

## 2019-10-28 DIAGNOSIS — Z03818 Encounter for observation for suspected exposure to other biological agents ruled out: Secondary | ICD-10-CM | POA: Diagnosis not present

## 2019-10-28 DIAGNOSIS — Z20828 Contact with and (suspected) exposure to other viral communicable diseases: Secondary | ICD-10-CM | POA: Diagnosis not present

## 2019-11-25 ENCOUNTER — Ambulatory Visit (INDEPENDENT_AMBULATORY_CARE_PROVIDER_SITE_OTHER)
Admission: RE | Admit: 2019-11-25 | Discharge: 2019-11-25 | Disposition: A | Discharge status: Home / Self Care | Payer: Self-pay | Source: Ambulatory Visit | Attending: Cardiology | Admitting: Cardiology

## 2019-11-25 ENCOUNTER — Other Ambulatory Visit: Payer: Self-pay

## 2019-11-25 DIAGNOSIS — R0789 Other chest pain: Secondary | ICD-10-CM

## 2019-11-25 DIAGNOSIS — E782 Mixed hyperlipidemia: Secondary | ICD-10-CM

## 2019-11-29 ENCOUNTER — Telehealth: Payer: Self-pay | Admitting: Cardiology

## 2019-11-29 NOTE — Telephone Encounter (Signed)
-----   Message from Berton Bon, NP sent at 11/29/2019 11:09 AM EST ----- Good news. Calcium score is 0, meaning no plague was noted in the heart arteries. Continue with heart healthy diet and exercise to prevent future heart disease.  Please send copy to Joycelyn Rua, MD  Berton Bon, NP

## 2019-11-29 NOTE — Telephone Encounter (Signed)
The patient has been notified of the result and recommendations. Patient verbalized understanding.  All questions (if any) were answered. Copy forwarded to PCP. Lattie Haw, RN 11/29/2019 3:03 PM

## 2019-11-29 NOTE — Telephone Encounter (Signed)
Follow Up:   Returning call, concerning his CT results.

## 2020-02-03 DIAGNOSIS — K5792 Diverticulitis of intestine, part unspecified, without perforation or abscess without bleeding: Secondary | ICD-10-CM | POA: Diagnosis not present

## 2020-07-05 IMAGING — CT CT CARDIAC CORONARY ARTERY CALCIUM SCORE
3 series · 14 of 20 positions shown, 15 images · non-contrast
Comparison: None.
COMPARISON: None.

Addendum:
EXAM:
OVER-READ INTERPRETATION  CT CHEST

The following report is an over-read performed by radiologist Dr.
Shonte Murry [REDACTED] on 11/25/2019. This over-read
does not include interpretation of cardiac or coronary anatomy or
pathology. The coronary calcium score interpretation by the
cardiologist is attached.
CLINICAL DATA: Risk stratification
Coronary Calcium Score
TECHNIQUE: The patient was scanned on a Siemens Force scanner. Axial
non-contrast 3 mm slices were carried out through the heart. The
data set was analyzed on a dedicated work station and scored using
the Agatson method.

[Series 2: casc 3.0 bv41 2 bestdiast 68 % · axial · 0.39mm/px · z∈[-216,-135]mm · 4 of 45 slices shown, 5 images]
[im 9/45  vessel]
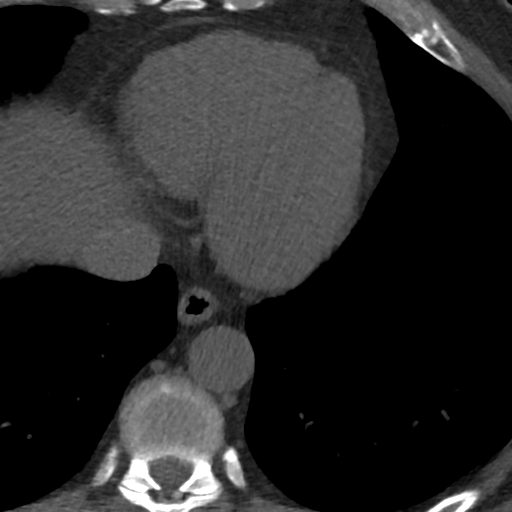
[im 9/45  lung]
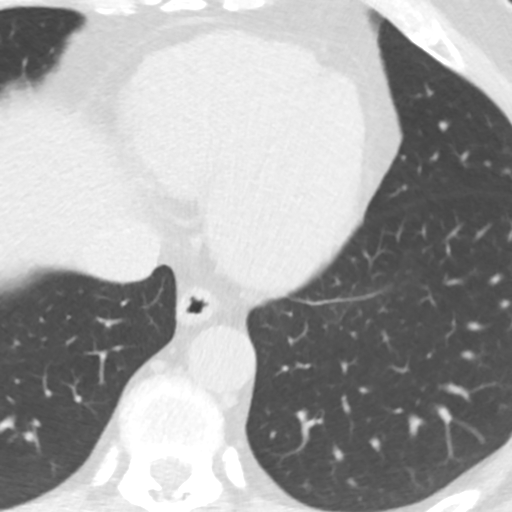
[im 18/45  vessel]
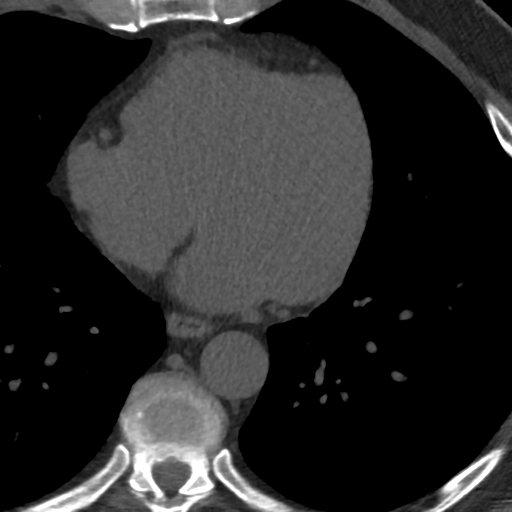
[im 27/45  vessel]
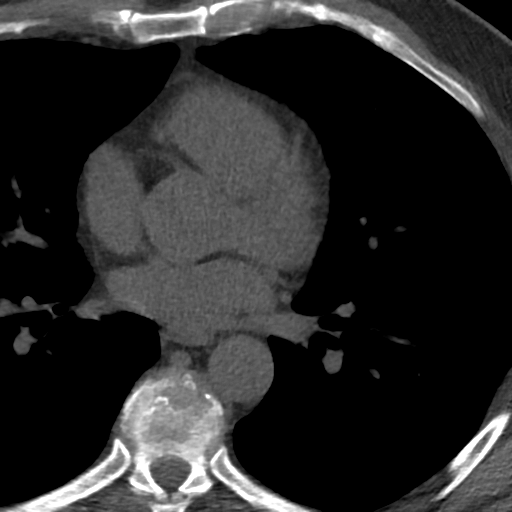
[im 36/45  vessel]
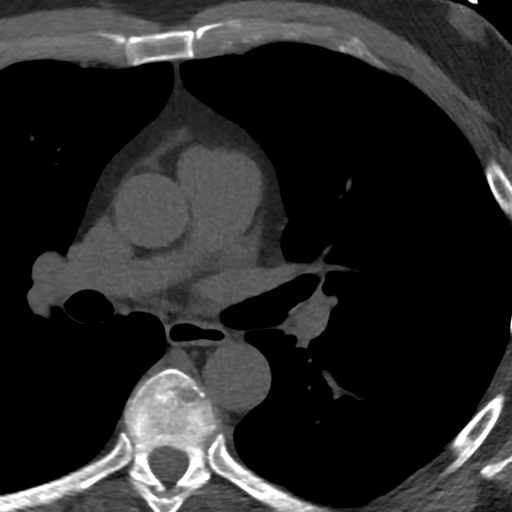

[Series 3: lung 69 % · axial · 0.71mm/px · z∈[-218,-132]mm · 5 of 45 slices shown]
[im 8/45  lung]
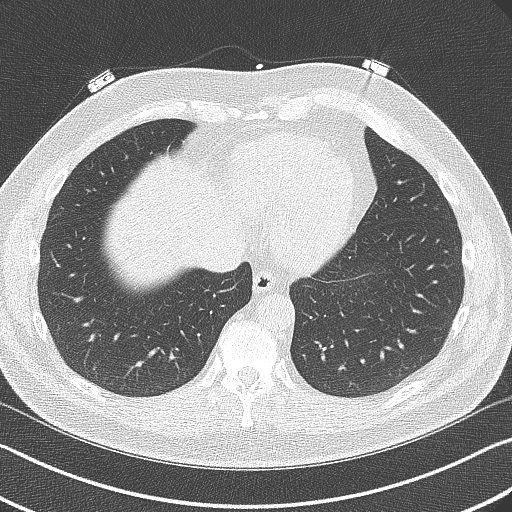
[im 15/45  lung]
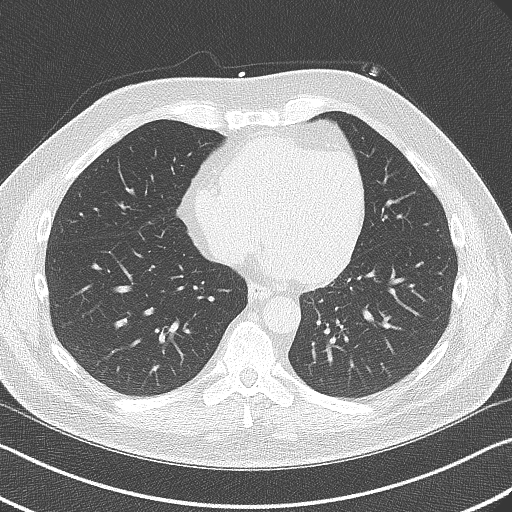
[im 23/45  lung]
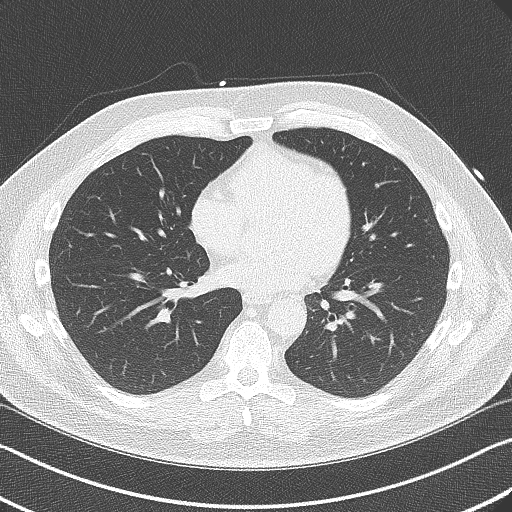
[im 30/45  lung]
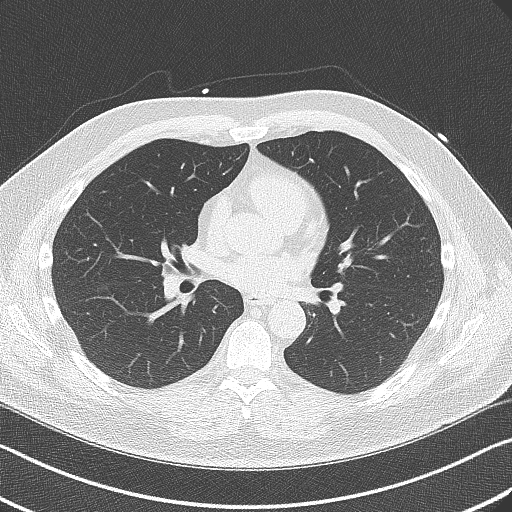
[im 37/45  lung]
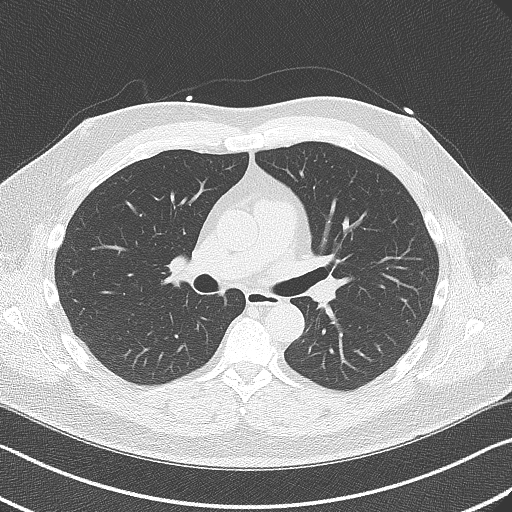

[Series 4: lung st 69 % · axial · 0.71mm/px · z∈[-218,-132]mm · 5 of 45 slices shown]
[im 8/45  lung]
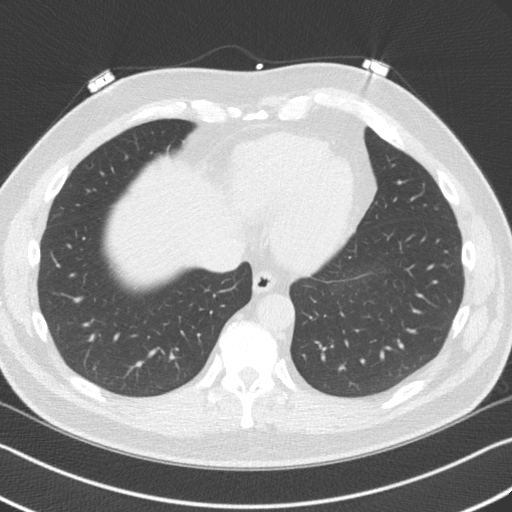
[im 15/45  lung]
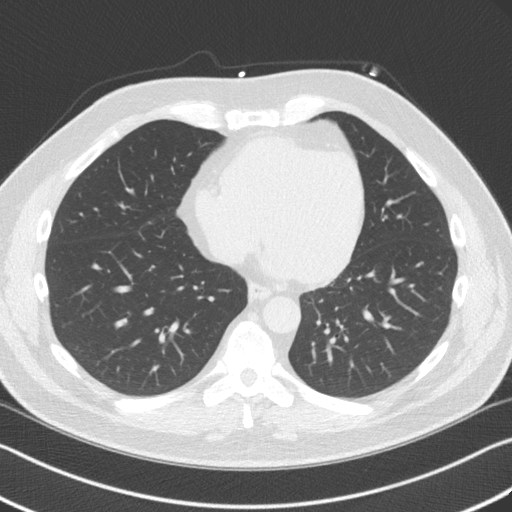
[im 23/45  lung]
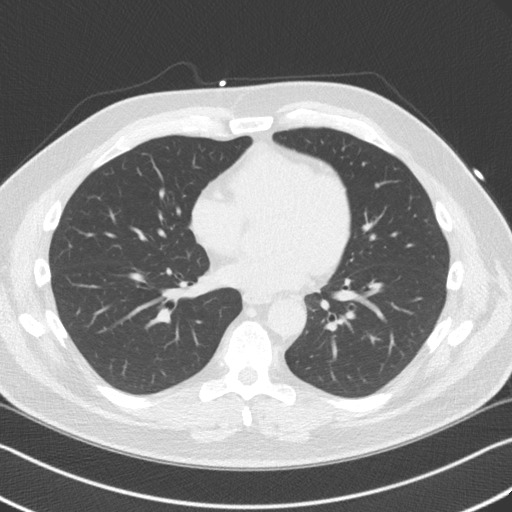
[im 30/45  lung]
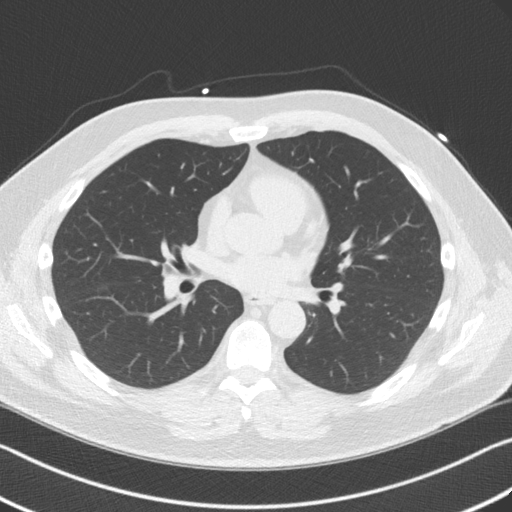
[im 37/45  lung]
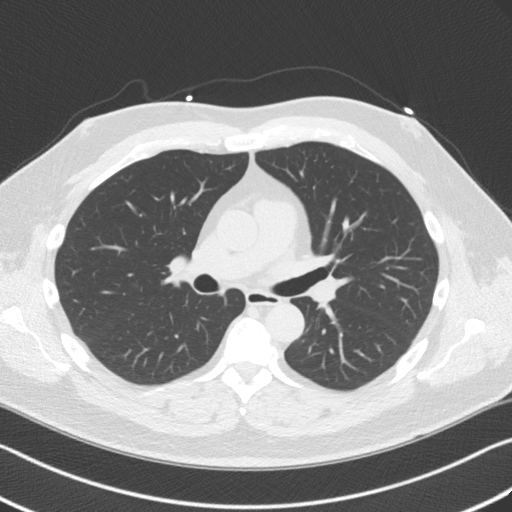

[14 of 20 positions shown; findings below may reference images not displayed]

FINDINGS: Vascular: Heart is normal size.  Visualized aorta normal caliber.

Mediastinum/Nodes: No adenopathy in the lower mediastinum or hila.

Lungs/Pleura: 1-2 mm calcified granuloma in the left lower lobe. No
confluent opacities or effusions.

Upper Abdomen: Imaging into the upper abdomen shows no acute
findings.

Musculoskeletal: Chest wall soft tissues are unremarkable. No acute
bony abnormality.
IMPRESSION: No acute extra cardiac abnormality.
FINDINGS: Non-cardiac: See separate report from [REDACTED].

Ascending Aorta: Normal size, no calcifications.

Pericardium: Normal.

Coronary arteries: Normal origin.
IMPRESSION: Coronary calcium score of 0. This was 0 percentile for age and sex
matched control.

*** End of Addendum ***
EXAM:
OVER-READ INTERPRETATION  CT CHEST

The following report is an over-read performed by radiologist Dr.
Shonte Murry [REDACTED] on 11/25/2019. This over-read
does not include interpretation of cardiac or coronary anatomy or
pathology. The coronary calcium score interpretation by the
cardiologist is attached.
FINDINGS: Vascular: Heart is normal size.  Visualized aorta normal caliber.

Mediastinum/Nodes: No adenopathy in the lower mediastinum or hila.

Lungs/Pleura: 1-2 mm calcified granuloma in the left lower lobe. No
confluent opacities or effusions.

Upper Abdomen: Imaging into the upper abdomen shows no acute
findings.

Musculoskeletal: Chest wall soft tissues are unremarkable. No acute
bony abnormality.
IMPRESSION: No acute extra cardiac abnormality.

## 2020-07-21 DIAGNOSIS — K5732 Diverticulitis of large intestine without perforation or abscess without bleeding: Secondary | ICD-10-CM | POA: Diagnosis not present

## 2020-07-21 DIAGNOSIS — R1032 Left lower quadrant pain: Secondary | ICD-10-CM | POA: Diagnosis not present

## 2020-08-10 DIAGNOSIS — Z131 Encounter for screening for diabetes mellitus: Secondary | ICD-10-CM | POA: Diagnosis not present

## 2020-08-10 DIAGNOSIS — Z13220 Encounter for screening for lipoid disorders: Secondary | ICD-10-CM | POA: Diagnosis not present

## 2020-08-10 DIAGNOSIS — Z0000 Encounter for general adult medical examination without abnormal findings: Secondary | ICD-10-CM | POA: Diagnosis not present

## 2020-08-10 DIAGNOSIS — Z23 Encounter for immunization: Secondary | ICD-10-CM | POA: Diagnosis not present

## 2020-08-21 DIAGNOSIS — H25812 Combined forms of age-related cataract, left eye: Secondary | ICD-10-CM | POA: Diagnosis not present

## 2020-08-21 DIAGNOSIS — Z9889 Other specified postprocedural states: Secondary | ICD-10-CM | POA: Diagnosis not present

## 2020-08-28 DIAGNOSIS — L820 Inflamed seborrheic keratosis: Secondary | ICD-10-CM | POA: Diagnosis not present

## 2020-08-28 DIAGNOSIS — L570 Actinic keratosis: Secondary | ICD-10-CM | POA: Diagnosis not present

## 2020-08-28 DIAGNOSIS — L821 Other seborrheic keratosis: Secondary | ICD-10-CM | POA: Diagnosis not present

## 2020-08-28 DIAGNOSIS — D225 Melanocytic nevi of trunk: Secondary | ICD-10-CM | POA: Diagnosis not present

## 2020-09-13 DIAGNOSIS — M25561 Pain in right knee: Secondary | ICD-10-CM | POA: Diagnosis not present

## 2020-10-02 DIAGNOSIS — L718 Other rosacea: Secondary | ICD-10-CM | POA: Diagnosis not present

## 2020-10-02 DIAGNOSIS — L821 Other seborrheic keratosis: Secondary | ICD-10-CM | POA: Diagnosis not present

## 2020-10-19 DIAGNOSIS — K640 First degree hemorrhoids: Secondary | ICD-10-CM | POA: Diagnosis not present

## 2020-10-19 DIAGNOSIS — Z1211 Encounter for screening for malignant neoplasm of colon: Secondary | ICD-10-CM | POA: Diagnosis not present

## 2020-10-19 DIAGNOSIS — K5730 Diverticulosis of large intestine without perforation or abscess without bleeding: Secondary | ICD-10-CM | POA: Diagnosis not present

## 2020-10-22 DIAGNOSIS — R1032 Left lower quadrant pain: Secondary | ICD-10-CM | POA: Diagnosis not present

## 2020-10-22 DIAGNOSIS — K5792 Diverticulitis of intestine, part unspecified, without perforation or abscess without bleeding: Secondary | ICD-10-CM | POA: Diagnosis not present

## 2020-11-03 ENCOUNTER — Ambulatory Visit (INDEPENDENT_AMBULATORY_CARE_PROVIDER_SITE_OTHER): Payer: BC Managed Care – PPO | Admitting: Cardiovascular Disease

## 2020-11-03 ENCOUNTER — Encounter: Payer: Self-pay | Admitting: Cardiovascular Disease

## 2020-11-03 ENCOUNTER — Other Ambulatory Visit: Payer: Self-pay

## 2020-11-03 VITALS — BP 102/78 | HR 70 | Ht 70.0 in | Wt 210.0 lb

## 2020-11-03 DIAGNOSIS — R0600 Dyspnea, unspecified: Secondary | ICD-10-CM

## 2020-11-03 DIAGNOSIS — R0609 Other forms of dyspnea: Secondary | ICD-10-CM

## 2020-11-03 DIAGNOSIS — R0789 Other chest pain: Secondary | ICD-10-CM | POA: Diagnosis not present

## 2020-11-03 DIAGNOSIS — R079 Chest pain, unspecified: Secondary | ICD-10-CM

## 2020-11-03 NOTE — Progress Notes (Signed)
Cardiology Office Note:    Date:  11/03/2020   ID:  Timothy Stout, DOB 1971-05-01, MRN [id number]  PCP:  Joycelyn Rua, MD  Cardiologist:   Gizelle Whetsel  Referring MD: Rosana Berger, MD    Problem List 1. Atypical CP 2. Migraine headaches  3. Arthroscopic knee surgery .  4.  Obstructive sleep apnea - borderline    Chief Complaint  Patient presents with  . Chest Pain    Previous notes from 2019    Timothy Stout is a 50 y.o. male with a hx of chest discomfort in the past.  We are asked to see him today by Dr. Royetta Asal for some recurrent episodes of chest discomfort.  Timothy Stout has had episodic chest pain for the past couple of years.  He has seen Dr. Mayford Knife in the past.  He had a normal stress test.  He is continued to have intermittent episodes of chest discomfort.  These seem to be related to the foods that he might eat.  The pain seems to be located towards the right or towards the left side of his chest.     the CP is not exertional. Feels like MSK pain.   Has been back on the Elliptical and he is working out regularly without any CP.  Lifted weights without any issues. Last night, he worked out - HR of 171   , had no CP  No dyspnea, no syncope or presyncope. , no PND or orthopnea.   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    Jan. 21, 2022: Timothy Stout is seen today for follow up of his atypical chest pain .  He was seen by Lizabeth Leyden, NP in Jan. 2021 - virtual visit  He continues to have atypical CP  Coronary calcium score from Feb. 2021 showed:  Coronary calcium score of 0. This was 0 percentile for age and sex matched control.  Has noticed a bit more fatigue than usual. Stopped working out for a while. Still does the elliptical  sporatically now - used to exercise regularly , but notices that he is more fatigued than he thought he should have been ( more dyspnea while carrying in wood )  Is intermittant  Wears his HR monitor with exercise,  Gets his HR 155-160 at peak  exercise  Sleeping ok, eating ok Had Covid in Aug.  Had fever for 12 days    Past Medical History:  Diagnosis Date  . Abnormal LFTs   . Acute hepatitis   . Diverticulitis   . GERD (gastroesophageal reflux disease)   . Hyperlipidemia   . Migraine   . OSA (obstructive sleep apnea)    MILD  . Rotator cuff tear, right   . Thumb weakness    LEFT    Past Surgical History:  Procedure Laterality Date  . INGUINAL HERNIA REPAIR  1993  . KNEE ARTHROSCOPY  09-06-2009   LEFT KNEE  . LUMBAR LAMINECTOMY/DECOMPRESSION MICRODISCECTOMY  02-17-2001   L5 - S1  . RIGHT KNEE ARTHROSCOPY  1999  . SHOULDER ARTHROSCOPY WITH LABRAL REPAIR  08/19/2012   Procedure: SHOULDER ARTHROSCOPY WITH LABRAL REPAIR;  Surgeon: Drucilla Schmidt, MD;  Location: [facility];  Service: Orthopedics;  Laterality: Right;  shaving of rotaor cuff  . SHOULDER ARTHROSCOPY WITH SUBACROMIAL DECOMPRESSION  08/19/2012   Procedure: SHOULDER ARTHROSCOPY WITH SUBACROMIAL DECOMPRESSION;  Surgeon: Drucilla Schmidt, MD;  Location: [facility];  Service: Orthopedics;  Laterality: Right;    Current Medications: Current Meds  Medication Sig  . aspirin 81 MG tablet Take 81 mg by mouth daily.  . magnesium oxide (MAG-OX) 400 MG tablet Take 400 mg by mouth daily.  . Multiple Vitamin (MULTIVITAMIN ADULT PO) Take 1 tablet by mouth daily.  . rizatriptan (MAXALT) 10 MG tablet Take 10 mg by mouth as needed for migraine. May repeat in 2 hours if needed     Allergies:   Patient has no known allergies.   Social History   Socioeconomic History  . Marital status: Married    Spouse name: Not on file  . Number of children: Not on file  . Years of education: Not on file  . Highest education level: Not on file  Occupational History  . Not on file  Tobacco Use  . Smoking status: Never Smoker  . Smokeless tobacco: Never Used  Substance and Sexual Activity  . Alcohol use: Yes    Comment: OCCASIONAL  . Drug  use: No  . Sexual activity: Not on file  Other Topics Concern  . Not on file  Social History Narrative  . Not on file   Social Determinants of Health   Financial Resource Strain: Not on file  Food Insecurity: Not on file  Transportation Needs: Not on file  Physical Activity: Not on file  Stress: Not on file  Social Connections: Not on file     Family History: The patient's family history includes Alzheimer's disease in his father; COPD in his father; Cancer - Colon in his father; Diabetes in his father; Heart attack in his maternal grandmother; Heart failure in his mother; Lupus in his mother; Prostate cancer in his father.  ROS:   Please see the history of present illness.     All other systems reviewed and are negative.  EKGs/Labs/Other Studies Reviewed:    The following studies were reviewed today:     Recent Labs: No results found for requested labs within last 8760 hours.  Recent Lipid Panel No results found for: CHOL, TRIG, HDL, CHOLHDL, VLDL, LDLCALC, LDLDIRECT  Physical Exam:    Physical Exam: Blood pressure 102/78, pulse 70, height 5\' 10"  (1.778 m), weight 210 lb (95.3 kg), SpO2 97 %.  GEN:  Well nourished, well developed in no acute distress HEENT: Normal NECK: No JVD; No carotid bruits LYMPHATICS: No lymphadenopathy CARDIAC: RRR , no murmurs, rubs, gallops RESPIRATORY:  Clear to auscultation without rales, wheezing or rhonchi  ABDOMEN: Soft, non-tender, non-distended MUSCULOSKELETAL:  No edema; No deformity  SKIN: Warm and dry NEUROLOGIC:  Alert and oriented x 3  EKG:    Jan. 21, 2022. NSR at 70  , no ST or T wave changes.    ASSESSMENT:    1. Dyspnea on exertion   2. Atypical chest pain    PLAN:     1. Atypical chest pain: His chest pain seems to have resolved.  His coronary calcium score is 0 as of last year.  2.  Dyspnea on exertion: He seems to be having dyspnea on exertion that seems to be out of proportion to what he is used to.  He  did have COVID last year so this could be some long-term issue with COVID.  I would like to get an echocardiogram for further evaluation of his cardiac function, valvular function and into make sure that he has  not developed pulmonary hypertension.   2.  Mild hyperlipidemia:  Managed by his medical doctor    Medication Adjustments/Labs and Tests Ordered: Current medicines are reviewed  at length with the patient today.  Concerns regarding medicines are outlined above.  Orders Placed This Encounter  Procedures  . EKG 12-Lead  . ECHOCARDIOGRAM COMPLETE   No orders of the defined types were placed in this encounter.   Signed, Kristeen Miss, MD  11/03/2020 10:40 AM    [facility]

## 2020-11-03 NOTE — Patient Instructions (Signed)
Medication Instructions:  The current medical regimen is effective;  continue present plan and medications.  *If you need a refill on your cardiac medications before your next appointment, please call your pharmacy*  Testing/Procedures: Your physician has requested that you have an echocardiogram. Echocardiography is a painless test that uses sound waves to create images of your heart. It provides your doctor with information about the size and shape of your heart and how well your heart's chambers and valves are working. This procedure takes approximately one hour. There are no restrictions for this procedure.  Follow-Up: At [facility], you and your health needs are our priority.  As part of our continuing mission to provide you with exceptional heart care, we have created designated Provider Care Teams.  These Care Teams include your primary Cardiologist (physician) and Advanced Practice Providers (APPs -  Physician Assistants and Nurse Practitioners) who all work together to provide you with the care you need, when you need it.  We recommend signing up for the patient portal called "MyChart".  Sign up information is provided on this After Visit Summary.  MyChart is used to connect with patients for Virtual Visits (Telemedicine).  Patients are able to view lab/test results, encounter notes, upcoming appointments, etc.  Non-urgent messages can be sent to your provider as well.   To learn more about what you can do with MyChart, go to [email or web address].    Your next appointment:   6 month(s)  The format for your next appointment:   In Person  Provider:   You will see one of the following Advanced Practice Providers on your designated Care Team:    Tereso Newcomer, PA-C  Chelsea Aus, [place]  Thank you for choosing [facility]!!

## 2020-11-10 ENCOUNTER — Other Ambulatory Visit (HOSPITAL_COMMUNITY): Payer: BC Managed Care – PPO

## 2020-12-01 ENCOUNTER — Other Ambulatory Visit: Payer: Self-pay

## 2020-12-01 ENCOUNTER — Ambulatory Visit (HOSPITAL_COMMUNITY): Payer: BC Managed Care – PPO | Attending: Internal Medicine

## 2020-12-01 DIAGNOSIS — R0609 Other forms of dyspnea: Secondary | ICD-10-CM

## 2020-12-01 DIAGNOSIS — R0600 Dyspnea, unspecified: Secondary | ICD-10-CM | POA: Insufficient documentation

## 2020-12-01 LAB — ECHOCARDIOGRAM COMPLETE
Area-P 1/2: 2.76 cm2
S' Lateral: 3.3 cm

## 2020-12-04 ENCOUNTER — Telehealth: Payer: Self-pay | Admitting: Cardiovascular Disease

## 2020-12-04 NOTE — Telephone Encounter (Signed)
Pt aware of echo results ./cy 

## 2020-12-04 NOTE — Telephone Encounter (Signed)
  Pt returning call to get echo result 

## 2020-12-30 DIAGNOSIS — R1032 Left lower quadrant pain: Secondary | ICD-10-CM | POA: Diagnosis not present

## 2020-12-30 DIAGNOSIS — K5750 Diverticulosis of both small and large intestine without perforation or abscess without bleeding: Secondary | ICD-10-CM | POA: Diagnosis not present

## 2021-03-05 DIAGNOSIS — R1032 Left lower quadrant pain: Secondary | ICD-10-CM | POA: Diagnosis not present

## 2021-03-14 DIAGNOSIS — R1032 Left lower quadrant pain: Secondary | ICD-10-CM | POA: Diagnosis not present

## 2021-03-14 DIAGNOSIS — Z8719 Personal history of other diseases of the digestive system: Secondary | ICD-10-CM | POA: Diagnosis not present

## 2021-03-15 ENCOUNTER — Other Ambulatory Visit: Payer: Self-pay

## 2021-03-15 ENCOUNTER — Emergency Department (HOSPITAL_BASED_OUTPATIENT_CLINIC_OR_DEPARTMENT_OTHER)
Admission: EM | Admit: 2021-03-15 | Discharge: 2021-03-15 | Disposition: A | Discharge status: Home / Self Care | Payer: BC Managed Care – PPO | Attending: Emergency Medicine | Admitting: Emergency Medicine

## 2021-03-15 ENCOUNTER — Encounter (HOSPITAL_BASED_OUTPATIENT_CLINIC_OR_DEPARTMENT_OTHER): Payer: Self-pay | Admitting: Obstetrics and Gynecology

## 2021-03-15 ENCOUNTER — Emergency Department (HOSPITAL_BASED_OUTPATIENT_CLINIC_OR_DEPARTMENT_OTHER): Payer: BC Managed Care – PPO

## 2021-03-15 DIAGNOSIS — K5730 Diverticulosis of large intestine without perforation or abscess without bleeding: Secondary | ICD-10-CM | POA: Diagnosis not present

## 2021-03-15 DIAGNOSIS — Z7982 Long term (current) use of aspirin: Secondary | ICD-10-CM | POA: Insufficient documentation

## 2021-03-15 DIAGNOSIS — K760 Fatty (change of) liver, not elsewhere classified: Secondary | ICD-10-CM | POA: Diagnosis not present

## 2021-03-15 DIAGNOSIS — K5792 Diverticulitis of intestine, part unspecified, without perforation or abscess without bleeding: Secondary | ICD-10-CM | POA: Diagnosis not present

## 2021-03-15 DIAGNOSIS — R1032 Left lower quadrant pain: Secondary | ICD-10-CM | POA: Diagnosis not present

## 2021-03-15 LAB — CBC
HCT: 45.6 % (ref 39.0–52.0)
Hemoglobin: 16.1 g/dL (ref 13.0–17.0)
MCH: 31.3 pg (ref 26.0–34.0)
MCHC: 35.3 g/dL (ref 30.0–36.0)
MCV: 88.5 fL (ref 80.0–100.0)
Platelets: 190 10*3/uL (ref 150–400)
RBC: 5.15 MIL/uL (ref 4.22–5.81)
RDW: 12.1 % (ref 11.5–15.5)
WBC: 10.6 10*3/uL — ABNORMAL HIGH (ref 4.0–10.5)
nRBC: 0 % (ref 0.0–0.2)

## 2021-03-15 LAB — COMPREHENSIVE METABOLIC PANEL
ALT: 56 U/L — ABNORMAL HIGH (ref 0–44)
AST: 30 U/L (ref 15–41)
Albumin: 4.6 g/dL (ref 3.5–5.0)
Alkaline Phosphatase: 48 U/L (ref 38–126)
Anion gap: 7 (ref 5–15)
BUN: 17 mg/dL (ref 6–20)
CO2: 30 mmol/L (ref 22–32)
Calcium: 9.3 mg/dL (ref 8.9–10.3)
Chloride: 100 mmol/L (ref 98–111)
Creatinine, Ser: 0.98 mg/dL (ref 0.61–1.24)
GFR, Estimated: >60 mL/min (ref >60)
Glucose, Bld: 92 mg/dL (ref 70–99)
Potassium: 3.9 mmol/L (ref 3.5–5.1)
Sodium: 137 mmol/L (ref 135–145)
Total Bilirubin: 0.5 mg/dL (ref 0.3–1.2)
Total Protein: 7.7 g/dL (ref 6.5–8.1)

## 2021-03-15 LAB — URINALYSIS, ROUTINE W REFLEX MICROSCOPIC
Bilirubin Urine: NEGATIVE
Glucose, UA: NEGATIVE mg/dL
Hgb urine dipstick: NEGATIVE
Ketones, ur: NEGATIVE mg/dL
Leukocytes,Ua: NEGATIVE
Nitrite: NEGATIVE
Protein, ur: NEGATIVE mg/dL
Specific Gravity, Urine: 1.008 (ref 1.005–1.030)
pH: 5 (ref 5.0–8.0)

## 2021-03-15 LAB — LIPASE, BLOOD: Lipase: 45 U/L (ref 11–51)

## 2021-03-15 MED ORDER — IOHEXOL 9 MG/ML PO SOLN
250.0000 mL | ORAL | Status: AC
  Administered 2021-03-15 (×2): 250 mL via ORAL

## 2021-03-15 MED ORDER — SODIUM CHLORIDE 0.9 % IV SOLN: 1000.0000 mL | INTRAVENOUS | Status: DC

## 2021-03-15 MED ORDER — SODIUM CHLORIDE 0.9 % IV BOLUS (SEPSIS)
1000.0000 mL | Freq: Once | INTRAVENOUS | Status: AC
  Administered 2021-03-15: 1000 mL via INTRAVENOUS

## 2021-03-15 MED ORDER — ONDANSETRON 8 MG PO TBDP: 8.0000 mg | ORAL_TABLET | Freq: Three times a day (TID) | ORAL | 0 refills | Status: DC | PRN

## 2021-03-15 MED ORDER — METRONIDAZOLE 50 MG/ML ORAL SUSPENSION: 500.0000 mg | Freq: Three times a day (TID) | ORAL | 0 refills | Status: DC

## 2021-03-15 MED ORDER — CIPROFLOXACIN HCL 500 MG PO TABS: 500.0000 mg | ORAL_TABLET | Freq: Two times a day (BID) | ORAL | 0 refills | Status: AC

## 2021-03-15 MED ORDER — METRONIDAZOLE 50 MG/ML ORAL SUSPENSION: 500.0000 mg | Freq: Three times a day (TID) | ORAL | 0 refills | Status: AC

## 2021-03-15 NOTE — Discharge Instructions (Addendum)
Take the antibiotics as prescribed.  Follow-up with your doctor to make sure the symptoms are improving.  Return to the ED for fever , worsening symptoms

## 2021-03-15 NOTE — ED Triage Notes (Signed)
Patient reports ULQ pain in the abdomen. Patient has a hx of diverticulitis and was placed on antibiotics. Patient finished antibiotics today and is still having the pain. Patient reports no N/V/D. Patient denies Chest pain or SOB.Marland Kitchen

## 2021-03-15 NOTE — ED Provider Notes (Signed)
[facility] EMERGENCY DEPT Provider Note   CSN: [id number] Arrival date & time: 03/15/21  1710     History Chief Complaint  Patient presents with  . Abdominal Pain    Timothy Stout is a 50 y.o. male.  HPI   Patient presents to the ED for evaluation of abdominal pain.  Patient has a history of having diverticulitis in the past.  He started having abdominal pain over a week ago.  He was seen at an urgent care on the 23rd and was given treatment empirically for diverticulitis.  Patient noted some improvement however the last couple of days he started having some pain and discomfort again.  He was seen by his doctor yesterday and they plan to do an outpatient CT scan.  Patient was told to go to the emergency room if you start having any worsening pain.  Patient states the pain started increased today.  Its not severe right now but it has been increasing in intensity.  he denies any vomiting.  No fevers.  No dysuria Past Medical History:  Diagnosis Date  . Abnormal LFTs   . Acute hepatitis   . Diverticulitis   . GERD (gastroesophageal reflux disease)   . Hyperlipidemia   . Migraine   . OSA (obstructive sleep apnea)    MILD  . Rotator cuff tear, right   . Thumb weakness    LEFT    Patient Active Problem List   Diagnosis Date Noted  . H/O right inguinal hernia repair 1993 06/02/2014  . Diverticulitis of colon  06/02/2014  . Chest pain 03/01/2014    Past Surgical History:  Procedure Laterality Date  . INGUINAL HERNIA REPAIR  1993  . KNEE ARTHROSCOPY  09-06-2009   LEFT KNEE  . LUMBAR LAMINECTOMY/DECOMPRESSION MICRODISCECTOMY  02-17-2001   L5 - S1  . RIGHT KNEE ARTHROSCOPY  1999  . SHOULDER ARTHROSCOPY WITH LABRAL REPAIR  08/19/2012   Procedure: SHOULDER ARTHROSCOPY WITH LABRAL REPAIR;  Surgeon: Drucilla Schmidt, MD;  Location: [facility];  Service: Orthopedics;  Laterality: Right;  shaving of rotaor cuff  . SHOULDER ARTHROSCOPY WITH  SUBACROMIAL DECOMPRESSION  08/19/2012   Procedure: SHOULDER ARTHROSCOPY WITH SUBACROMIAL DECOMPRESSION;  Surgeon: Drucilla Schmidt, MD;  Location: [facility];  Service: Orthopedics;  Laterality: Right;       Family History  Problem Relation Age of Onset  . Lupus Mother   . Heart failure Mother   . Alzheimer's disease Father   . Prostate cancer Father   . COPD Father   . Diabetes Father   . Cancer - Colon Father   . Heart attack Maternal Grandmother     Social History   Tobacco Use  . Smoking status: Never Smoker  . Smokeless tobacco: Never Used  Substance Use Topics  . Alcohol use: Yes    Comment: OCCASIONAL  . Drug use: No    Home Medications Prior to Admission medications   Medication Sig Start Date End Date Taking? Authorizing Provider  ciprofloxacin (CIPRO) 500 MG tablet Take 1 tablet (500 mg total) by mouth 2 (two) times daily for 10 days. 03/15/21 03/25/21 Yes Linwood Dibbles, MD  ondansetron (ZOFRAN ODT) 8 MG disintegrating tablet Take 1 tablet (8 mg total) by mouth every 8 (eight) hours as needed for nausea or vomiting. 03/15/21  Yes Linwood Dibbles, MD  aspirin 81 MG tablet Take 81 mg by mouth daily.    [provider]  magnesium oxide (MAG-OX) 400 MG tablet  Take 400 mg by mouth daily.    [provider]  metroNIDAZOLE (FLAGYL) 50 mg/ml oral suspension Take 10 mLs (500 mg total) by mouth 3 (three) times daily for 10 days. 03/15/21 03/25/21  Linwood Dibbles, MD  Multiple Vitamin (MULTIVITAMIN ADULT PO) Take 1 tablet by mouth daily.    [provider]  rizatriptan (MAXALT) 10 MG tablet Take 10 mg by mouth as needed for migraine. May repeat in 2 hours if needed    [provider]    Allergies    Patient has no known allergies.  Review of Systems   Review of Systems  All other systems reviewed and are negative.   Physical Exam Updated Vital Signs BP (!) 136/97   Pulse 76   Temp 98.8 F (37.1 C) (Oral)   Resp 18   SpO2 99%    Physical Exam Vitals and nursing note reviewed.  Constitutional:      General: He is not in acute distress.    Appearance: He is well-developed.  HENT:     Head: Normocephalic and atraumatic.     Right Ear: External ear normal.     Left Ear: External ear normal.  Eyes:     General: No scleral icterus.       Right eye: No discharge.        Left eye: No discharge.     Conjunctiva/sclera: Conjunctivae normal.  Neck:     Trachea: No tracheal deviation.  Cardiovascular:     Rate and Rhythm: Normal rate and regular rhythm.  Pulmonary:     Effort: Pulmonary effort is normal. No respiratory distress.     Breath sounds: Normal breath sounds. No stridor. No wheezing or rales.  Abdominal:     General: Bowel sounds are normal. There is no distension.     Palpations: Abdomen is soft.     Tenderness: There is abdominal tenderness in the left lower quadrant. There is no guarding or rebound.  Musculoskeletal:        General: No tenderness.     Cervical back: Neck supple.  Skin:    General: Skin is warm and dry.     Findings: No rash.  Neurological:     Mental Status: He is alert.     Cranial Nerves: No cranial nerve deficit (no facial droop, extraocular movements intact, no slurred speech).     Sensory: No sensory deficit.     Motor: No abnormal muscle tone or seizure activity.     Coordination: Coordination normal.     ED Results / Procedures / Treatments   Labs (all labs ordered are listed, but only abnormal results are displayed) Labs Reviewed  COMPREHENSIVE METABOLIC PANEL - Abnormal; Notable for the following components:      Result Value   ALT 56 (*)    All other components within normal limits  CBC - Abnormal; Notable for the following components:   WBC 10.6 (*)    All other components within normal limits  URINALYSIS, ROUTINE W REFLEX MICROSCOPIC - Abnormal; Notable for the following components:   Color, Urine COLORLESS (*)    All other components within normal limits   LIPASE, BLOOD    EKG None  Radiology CT ABDOMEN PELVIS WO CONTRAST  Result Date: 03/15/2021 CLINICAL DATA:  Left-sided abdominal pain. Diverticulitis suspected. EXAM: CT ABDOMEN AND PELVIS WITHOUT CONTRAST TECHNIQUE: Multidetector CT imaging of the abdomen and pelvis was performed following the standard protocol without IV contrast. COMPARISON:  None. FINDINGS: Lower  chest: No acute airspace disease or pleural effusion. Heart is normal in size. Hepatobiliary: Diffuse hepatic steatosis with areas of focal sparing adjacent to the gallbladder fossa. No evidence of focal liver lesion. Gallbladder physiologically distended, no calcified stone. No biliary dilatation. Pancreas: No ductal dilatation or inflammation. Spleen: Normal in size without focal abnormality. Adrenals/Urinary Tract: Normal adrenal glands. No hydronephrosis or renal calculi. No perinephric edema. No focal renal lesion. Partially distended urinary bladder without wall thickening or stone. Stomach/Bowel: Colonic wall thickening and pericolonic edema involving the distal descending colon in the region of multiple diverticula, suspicious for acute diverticulitis. Trace fluid in the pericolic gutter without focal fluid collection or perforation. Multiple additional noninflamed diverticula most prominently affecting the distal colon. Normal small bowel and appendix. Small hiatal hernia otherwise unremarkable stomach. Vascular/Lymphatic: Minimal aortic atherosclerosis. No aortic aneurysm. No bulky abdominopelvic adenopathy. Reproductive: Prostate is unremarkable. Other: Fat stranding and trace free fluid in the left pericolic gutter. No focal fluid collection or free air. No abdominal wall hernia. Musculoskeletal: There are no acute or suspicious osseous abnormalities. Mild L5-S1 degenerative disc disease. IMPRESSION: 1. Acute uncomplicated diverticulitis of the distal descending colon. No perforation or abscess. 2. Hepatic steatosis. 3. Small  hiatal hernia. Aortic Atherosclerosis (ICD10-I70.0). Electronically Signed   By: Narda Rutherford M.D.   On: 03/15/2021 19:59    Procedures Procedures   Medications Ordered in ED Medications  sodium chloride 0.9 % bolus 1,000 mL (1,000 mLs Intravenous New Bag/Given 03/15/21 1821)    Followed by  0.9 %  sodium chloride infusion (has no administration in time range)  iohexol (OMNIPAQUE) 9 MG/ML oral solution 250 mL (250 mLs Oral Contrast Given 03/15/21 1916)    ED Course  I have reviewed the triage vital signs and the nursing notes.  Pertinent labs & imaging results that were available during my care of the patient were reviewed by me and considered in my medical decision making (see chart for details).  Clinical Course as of 03/15/21 2032  Thu Mar 15, 2021  2007 CT scan shows evidence of diverticulitis without complication [JK]    Clinical Course User Index [JK] Linwood Dibbles, MD   MDM Rules/Calculators/A&P                          Patient presented to the ED for evaluation of abdominal pain.  Patient recently had empiric treatment for diverticulitis.  Patient's exam is reassuring.  He has no guarding.  No peritoneal signs.  He has not had any fevers or vomiting.  CT scan was performed and it does demonstrate diverticulitis.  Patient had been on a course of Augmentin.  Discussed admitting for IV antibiotics versus another course of oral antibiotics as there are no complicating features on the CAT scan and he is not having any significant pain and has not been vomiting.  Patient would like to try another course of antibiotics.  We will try Cipro Flagyl.  He would prefer liquid as it is difficult to swallow the Flagyl tablets.  Warning signs precautions discussed Final Clinical Impression(s) / ED Diagnoses Final diagnoses:  Diverticulitis    Rx / DC Orders ED Discharge Orders         Ordered    ciprofloxacin (CIPRO) 500 MG tablet  2 times daily        03/15/21 2029    metroNIDAZOLE  (FLAGYL) 50 mg/ml oral suspension  3 times daily,   Status:  Discontinued  03/15/21 2029    ondansetron (ZOFRAN ODT) 8 MG disintegrating tablet  Every 8 hours PRN        03/15/21 2029    metroNIDAZOLE (FLAGYL) 50 mg/ml oral suspension  3 times daily        03/15/21 2031           Linwood DibblesKnapp, Lakrista Scaduto, MD 03/15/21 2033

## 2021-05-01 DIAGNOSIS — R1032 Left lower quadrant pain: Secondary | ICD-10-CM | POA: Diagnosis not present

## 2021-05-01 DIAGNOSIS — Z8719 Personal history of other diseases of the digestive system: Secondary | ICD-10-CM | POA: Diagnosis not present

## 2021-05-11 DIAGNOSIS — G43109 Migraine with aura, not intractable, without status migrainosus: Secondary | ICD-10-CM | POA: Diagnosis not present

## 2021-05-16 DIAGNOSIS — Z8719 Personal history of other diseases of the digestive system: Secondary | ICD-10-CM | POA: Diagnosis not present

## 2021-05-29 ENCOUNTER — Ambulatory Visit: Payer: BC Managed Care – PPO | Admitting: Neurology

## 2021-06-25 ENCOUNTER — Ambulatory Visit: Payer: BC Managed Care – PPO | Admitting: Neurology

## 2021-07-05 ENCOUNTER — Ambulatory Visit: Payer: BC Managed Care – PPO | Admitting: Neurology

## 2021-07-05 ENCOUNTER — Other Ambulatory Visit: Payer: Self-pay

## 2021-07-05 ENCOUNTER — Encounter: Payer: Self-pay | Admitting: Podiatry

## 2021-07-05 ENCOUNTER — Ambulatory Visit (INDEPENDENT_AMBULATORY_CARE_PROVIDER_SITE_OTHER): Payer: BC Managed Care – PPO

## 2021-07-05 ENCOUNTER — Ambulatory Visit (INDEPENDENT_AMBULATORY_CARE_PROVIDER_SITE_OTHER): Payer: BC Managed Care – PPO | Admitting: Podiatry

## 2021-07-05 DIAGNOSIS — M779 Enthesopathy, unspecified: Secondary | ICD-10-CM | POA: Diagnosis not present

## 2021-07-05 DIAGNOSIS — M79674 Pain in right toe(s): Secondary | ICD-10-CM

## 2021-07-05 DIAGNOSIS — M7751 Other enthesopathy of right foot: Secondary | ICD-10-CM | POA: Diagnosis not present

## 2021-07-05 DIAGNOSIS — M205X1 Other deformities of toe(s) (acquired), right foot: Secondary | ICD-10-CM

## 2021-07-05 MED ORDER — TRIAMCINOLONE ACETONIDE 10 MG/ML IJ SUSP
10.0000 mg | Freq: Once | INTRAMUSCULAR | Status: AC
  Administered 2021-07-05: 10 mg

## 2021-07-06 NOTE — Progress Notes (Signed)
Subjective:   Patient ID: Timothy Stout, male   DOB: 50 y.o.   MRN: [id number]   HPI Patient states he is developed a lot of pain in his big toe joint and into the big toe over the last few weeks.  States that he does not remember specific injury and it is worse with certain types of shoes or when he has to bend the toe.  States he does feel like he is also walking different and patient does not smoke likes to be active   Review of Systems  All other systems reviewed and are negative.      Objective:  Physical Exam Vitals and nursing note reviewed.  Constitutional:      Appearance: He is well-developed.  Pulmonary:     Effort: Pulmonary effort is normal.  Musculoskeletal:        General: Normal range of motion.  Skin:    General: Skin is warm.  Neurological:     Mental Status: He is alert.    Neurovascular status intact muscle strength found to be adequate range of motion within normal limits.  Patient is noted to have inflammation and pain around the first MPJ right with moderate limitation of motion and no crepitus of the joint.  Patient is found to have good digital perfusion is well oriented x3     Assessment:  Probability for an inflammatory change of the first MPJ right with functional hallux limitus and the possibility for the beginning to structural hallux limitus forming     Plan:  H&P reviewed condition explained to patient.  At this point I have recommended a conservative approach with rigid bottom shoes stretching and I did because of the inflammation inject periarticular around the first MPJ 3 mg Dexasone Kenalog 5 mg Xylocaine and advised him on the condition and what to watch for in future and the possibility for surgical intervention at 1 point in future  X-rays indicate there is functional elevation of the first metatarsal segment with the very beginnings of dorsal spurring present right

## 2021-07-10 ENCOUNTER — Other Ambulatory Visit: Payer: Self-pay | Admitting: Podiatry

## 2021-07-10 DIAGNOSIS — M779 Enthesopathy, unspecified: Secondary | ICD-10-CM

## 2021-07-30 ENCOUNTER — Encounter: Payer: Self-pay | Admitting: Neurology

## 2021-07-30 ENCOUNTER — Ambulatory Visit (INDEPENDENT_AMBULATORY_CARE_PROVIDER_SITE_OTHER): Payer: BC Managed Care – PPO | Admitting: Neurology

## 2021-07-30 VITALS — BP 119/80 | HR 73 | Ht 71.0 in | Wt 205.0 lb

## 2021-07-30 DIAGNOSIS — G43109 Migraine with aura, not intractable, without status migrainosus: Secondary | ICD-10-CM | POA: Diagnosis not present

## 2021-07-30 MED ORDER — MAGNESIUM 400 MG PO TABS: 400.0000 mg | ORAL_TABLET | Freq: Every evening | ORAL | 2 refills | Status: AC

## 2021-07-30 MED ORDER — RIBOFLAVIN 400 MG PO TABS: 400.0000 mg | ORAL_TABLET | Freq: Every evening | ORAL | 2 refills | Status: AC

## 2021-07-30 MED ORDER — COENZYME Q10 100 MG PO CAPS: 100.0000 mg | ORAL_CAPSULE | Freq: Every evening | ORAL | 2 refills | Status: AC

## 2021-07-30 NOTE — Patient Instructions (Signed)
Take the following for migraine prevention  Magnesium 400 mg nightly  Riboflavin 400 mg nightly  CoQ10 100 mg nightly   Continue with Maxalt as needed for migraines  MRI Brain with and without contrast  Return in 6 months or sooner if worse

## 2021-07-30 NOTE — Progress Notes (Signed)
[facility]  PATIENT: Timothy Stout DOB: 11/06/1970  REFERRING CLINICIAN: Mitzi Hansen, NP HISTORY FROM: Patient  REASON FOR VISIT: Migraine Headache   HISTORICAL  CHIEF COMPLAINT:  Chief Complaint  Patient presents with   New Patient (Initial Visit)    Rm 12, alone, worsening migraine with aura and visual disturbances, states maxalt is working well, reports 2-3 a month     HISTORY OF PRESENT ILLNESS:  This is a 50 year old gentleman with past medical history of migraine headaches (diagnosed since high school), recurrent diverticulitis who is presenting for management of his migraine.  Patient stated that in the past he used to have migraine once every 22-months and was put on magnesium supplement and at that time, then frequency improved to 1-2 migraine per year.  He is described migraine with aura, describes aura as loss of peripheral vision mostly on the left prior to start of headache.  Usually when she gets the aura patient will take Maxalt and it will abort the episode without any development of headaches and his vision will come back to normal.  After each episode he feels drained and tired.  Patient said for the past few month he has been having increase in Migraines, currently he is having 1 migraine every 2 weeks.  Again his migraines are well controlled with the Maxalt.  Denies any nausea, denies any vomiting, denies any photophobia or phonophobia.  He reported in the past he used to have photophobia but again Maxalt to abort the episode prior to the start of the headache.  When he does have the headaches he described as diffuse throbbing pain.  He has a strong family history of migraine including father and all 4 children's.    Headache History and Characteristics: Onset: Since high school,  Location: Diffuse pain  Quality: Throbbing.  Intensity:2-3 /10.  Duration: half hour to an hour  Migrainous Features: None  Aura: Yes, left field visual  cut  History of brain injury or tumor: No  Family history: Father, all 4 kids with migraines  Motion sickness: no Cardiac history: no  DTO:[id number], occasionally  Caffeine: Yes, daily coffee Sleep: very good  Mood/ Stress: Work related stress   Prior prophylaxis: Propranolol: No  Verapamil:No TCA: No Topamax: No Depakote: No Effexor: No Cymbalta: No Neurontin:No  Prior abortives: Triptan: Yes, works well  Anti-emetic: No Steroids: No Ergotamine suppository: No   OTHER MEDICAL CONDITIONS: Diverticulitis    REVIEW OF SYSTEMS: Full 14 system review of systems performed and negative with exception of: as needed   ALLERGIES: No Known Allergies  HOME MEDICATIONS: Outpatient Medications Prior to Visit  Medication Sig Dispense Refill   Multiple Vitamin (MULTIVITAMIN ADULT PO) Take 1 tablet by mouth daily.     rizatriptan (MAXALT) 10 MG tablet Take by mouth.     magnesium oxide (MAG-OX) 400 MG tablet Take 400 mg by mouth daily.     No facility-administered medications prior to visit.    PAST MEDICAL HISTORY: Past Medical History:  Diagnosis Date   Abnormal LFTs    Acute hepatitis    Diverticulitis    GERD (gastroesophageal reflux disease)    Hyperlipidemia    Migraine    OSA (obstructive sleep apnea)    MILD   Rotator cuff tear, right    Thumb weakness    LEFT    PAST SURGICAL HISTORY: Past Surgical History:  Procedure Laterality Date   INGUINAL HERNIA REPAIR  1993   KNEE ARTHROSCOPY  09-06-2009  LEFT KNEE   LUMBAR LAMINECTOMY/DECOMPRESSION MICRODISCECTOMY  02-17-2001   L5 - S1   RIGHT KNEE ARTHROSCOPY  1999   SHOULDER ARTHROSCOPY WITH LABRAL REPAIR  08/19/2012   Procedure: SHOULDER ARTHROSCOPY WITH LABRAL REPAIR;  Surgeon: Drucilla Schmidt, MD;  Location: [facility];  Service: Orthopedics;  Laterality: Right;  shaving of rotaor cuff   SHOULDER ARTHROSCOPY WITH SUBACROMIAL DECOMPRESSION  08/19/2012   Procedure: SHOULDER ARTHROSCOPY  WITH SUBACROMIAL DECOMPRESSION;  Surgeon: Drucilla Schmidt, MD;  Location: [facility];  Service: Orthopedics;  Laterality: Right;    FAMILY HISTORY: Family History  Problem Relation Age of Onset   Lupus Mother    Heart failure Mother    Alzheimer's disease Father    Prostate cancer Father    COPD Father    Diabetes Father    Cancer - Colon Father    Heart attack Maternal Grandmother     SOCIAL HISTORY: Social History   Socioeconomic History   Marital status: Married    Spouse name: Not on file   Number of children: Not on file   Years of education: Not on file   Highest education level: Not on file  Occupational History   Not on file  Tobacco Use   Smoking status: Never   Smokeless tobacco: Never  Substance and Sexual Activity   Alcohol use: Yes    Comment: OCCASIONAL   Drug use: No   Sexual activity: Not Currently  Other Topics Concern   Not on file  Social History Narrative   Not on file   Social Determinants of Health   Financial Resource Strain: Not on file  Food Insecurity: Not on file  Transportation Needs: Not on file  Physical Activity: Not on file  Stress: Not on file  Social Connections: Not on file  Intimate Partner Violence: Not on file     PHYSICAL EXAM  GENERAL EXAM/CONSTITUTIONAL: Vitals:  Vitals:   07/30/21 0813  BP: 119/80  Pulse: 73  Weight: 205 lb (93 kg)  Height: 5\' 11"  (1.803 m)   Body mass index is 28.59 kg/m. Wt Readings from Last 3 Encounters:  07/30/21 205 lb (93 kg)  11/03/20 210 lb (95.3 kg)  10/28/17 219 lb 12.8 oz (99.7 kg)   Patient is in no distress; well developed, nourished and groomed; neck is supple  EYES: Pupils round and reactive to light, Visual fields full to confrontation, Extraocular movements intacts,   MUSCULOSKELETAL: Gait, strength, tone, movements noted in Neurologic exam below  NEUROLOGIC: MENTAL STATUS:  No flowsheet data found. awake, alert, oriented to person, place and  time recent and remote memory intact normal attention and concentration language fluent, comprehension intact, naming intact fund of knowledge appropriate  CRANIAL NERVE: 2nd, 3rd, 4th, 6th - pupils equal and reactive to light, visual fields full to confrontation, extraocular muscles intact, no nystagmus 5th - facial sensation symmetric 7th - facial strength symmetric 8th - hearing intact 9th - palate elevates symmetrically, uvula midline 11th - shoulder shrug symmetric 12th - tongue protrusion midline  MOTOR:  normal bulk and tone, full strength in the BUE, BLE   COORDINATION:  finger-nose-finger, fine finger movements normal   GAIT/STATION:  normal    DIAGNOSTIC DATA (LABS, IMAGING, TESTING) - I reviewed patient records, labs, notes, testing and imaging myself where available.  Lab Results  Component Value Date   WBC 10.6 (H) 03/15/2021   HGB 16.1 03/15/2021   HCT 45.6 03/15/2021   MCV 88.5 03/15/2021  PLT 190 03/15/2021      Component Value Date/Time   NA 137 03/15/2021 1740   K 3.9 03/15/2021 1740   CL 100 03/15/2021 1740   CO2 30 03/15/2021 1740   GLUCOSE 92 03/15/2021 1740   BUN 17 03/15/2021 1740   CREATININE 0.98 03/15/2021 1740   CALCIUM 9.3 03/15/2021 1740   PROT 7.7 03/15/2021 1740   ALBUMIN 4.6 03/15/2021 1740   AST 30 03/15/2021 1740   ALT 56 (H) 03/15/2021 1740   ALKPHOS 48 03/15/2021 1740   BILITOT 0.5 03/15/2021 1740   GFRNONAA >60 03/15/2021 1740   GFRAA >90 02/22/2013 0048   No results found for: CHOL, HDL, LDLCALC, LDLDIRECT, TRIG, CHOLHDL No results found for: BSWH6P No results found for: VITAMINB12 No results found for: TSH   ASSESSMENT AND PLAN  50 y.o. year old male with past medical history of recurrent diverticulitis and migraine with aura who is presenting to establish care and management of his migraine headaches.  He reported increasing frequency in the past several month, 1 migraine with aura every 2 weeks when he was  having them 1 every 2 to 3 months.  Previously he was put on magnesium as preventive medication but reported he has not been consistent with the medication.  His Maxalt works very well.  Patient reported he has never had a brain MRI for evaluation of his migraine.  Due to the increased frequency in someone who is above 50, I will obtain a brain MRI with and without contrast to rule out secondary causes of migraine.  I will also start him on a combination of magnesium, riboflavin and co-Q10 to take nightly.  Advised him to continue with his Maxalt as needed for episode.  I will contact contact the patient to go over the MRI result otherwise I will see him in 6 months for follow-up.   1. Migraine with aura and without status migrainosus, not intractable      PLAN: Take the following for migraine prevention  Magnesium 400 mg nightly  Riboflavin 400 mg nightly  CoQ10 100 mg nightly   Continue with Maxalt as needed for migraines  MRI Brain with and without contrast  Return in 6 months or sooner if worse   Orders Placed This Encounter  Procedures   MR BRAIN W WO CONTRAST    Meds ordered this encounter  Medications   Magnesium 400 MG TABS    Sig: Take 400 mg by mouth at bedtime.    Dispense:  90 tablet    Refill:  2   Riboflavin 400 MG TABS    Sig: Take 400 mg by mouth at bedtime.    Dispense:  90 tablet    Refill:  2   Coenzyme Q10 100 MG capsule    Sig: Take 1 capsule (100 mg total) by mouth at bedtime.    Dispense:  90 capsule    Refill:  2    Return in about 6 months (around 01/28/2022).    Windell Norfolk, MD 07/30/2021, 8:58 AM  [facility] [address] [place], [place] [address] [phone]

## 2021-07-31 DIAGNOSIS — L570 Actinic keratosis: Secondary | ICD-10-CM | POA: Diagnosis not present

## 2021-07-31 DIAGNOSIS — D225 Melanocytic nevi of trunk: Secondary | ICD-10-CM | POA: Diagnosis not present

## 2021-07-31 DIAGNOSIS — L821 Other seborrheic keratosis: Secondary | ICD-10-CM | POA: Diagnosis not present

## 2021-07-31 DIAGNOSIS — L918 Other hypertrophic disorders of the skin: Secondary | ICD-10-CM | POA: Diagnosis not present

## 2021-07-31 DIAGNOSIS — D1801 Hemangioma of skin and subcutaneous tissue: Secondary | ICD-10-CM | POA: Diagnosis not present

## 2021-08-01 ENCOUNTER — Telehealth: Payer: Self-pay | Admitting: Neurology

## 2021-08-01 DIAGNOSIS — Z8719 Personal history of other diseases of the digestive system: Secondary | ICD-10-CM | POA: Diagnosis not present

## 2021-08-01 DIAGNOSIS — Z01818 Encounter for other preprocedural examination: Secondary | ICD-10-CM | POA: Diagnosis not present

## 2021-08-01 DIAGNOSIS — R0789 Other chest pain: Secondary | ICD-10-CM | POA: Diagnosis not present

## 2021-08-01 DIAGNOSIS — G43109 Migraine with aura, not intractable, without status migrainosus: Secondary | ICD-10-CM | POA: Diagnosis not present

## 2021-08-01 DIAGNOSIS — Z01810 Encounter for preprocedural cardiovascular examination: Secondary | ICD-10-CM | POA: Diagnosis not present

## 2021-08-01 DIAGNOSIS — Z01812 Encounter for preprocedural laboratory examination: Secondary | ICD-10-CM | POA: Diagnosis not present

## 2021-08-01 NOTE — Telephone Encounter (Signed)
i spoke with the patient he states he will give me a call back  LVM to schedule  [facility] #[phone]  (07/30/21- 08/28/21)

## 2021-08-06 NOTE — Telephone Encounter (Signed)
Patient called back he is scheduled at [facility] for 08/08/21

## 2021-08-08 ENCOUNTER — Other Ambulatory Visit: Payer: Self-pay

## 2021-08-08 ENCOUNTER — Ambulatory Visit (INDEPENDENT_AMBULATORY_CARE_PROVIDER_SITE_OTHER): Payer: BC Managed Care – PPO

## 2021-08-08 DIAGNOSIS — G43109 Migraine with aura, not intractable, without status migrainosus: Secondary | ICD-10-CM | POA: Diagnosis not present

## 2021-08-08 MED ORDER — GADOBENATE DIMEGLUMINE 529 MG/ML IV SOLN
20.0000 mL | Freq: Once | INTRAVENOUS | Status: AC | PRN
  Administered 2021-08-08: 20 mL via INTRAVENOUS

## 2021-08-15 DIAGNOSIS — E669 Obesity, unspecified: Secondary | ICD-10-CM | POA: Diagnosis not present

## 2021-08-15 DIAGNOSIS — K5732 Diverticulitis of large intestine without perforation or abscess without bleeding: Secondary | ICD-10-CM | POA: Diagnosis not present

## 2021-08-15 DIAGNOSIS — K449 Diaphragmatic hernia without obstruction or gangrene: Secondary | ICD-10-CM | POA: Diagnosis not present

## 2021-08-15 DIAGNOSIS — F17290 Nicotine dependence, other tobacco product, uncomplicated: Secondary | ICD-10-CM | POA: Diagnosis not present

## 2021-08-15 DIAGNOSIS — Z79899 Other long term (current) drug therapy: Secondary | ICD-10-CM | POA: Diagnosis not present

## 2021-08-15 DIAGNOSIS — R1032 Left lower quadrant pain: Secondary | ICD-10-CM | POA: Diagnosis not present

## 2021-08-15 DIAGNOSIS — G8918 Other acute postprocedural pain: Secondary | ICD-10-CM | POA: Diagnosis not present

## 2021-08-15 DIAGNOSIS — K769 Liver disease, unspecified: Secondary | ICD-10-CM | POA: Diagnosis not present

## 2021-08-15 DIAGNOSIS — K5730 Diverticulosis of large intestine without perforation or abscess without bleeding: Secondary | ICD-10-CM | POA: Diagnosis not present

## 2021-08-15 DIAGNOSIS — Z6828 Body mass index (BMI) 28.0-28.9, adult: Secondary | ICD-10-CM | POA: Diagnosis not present

## 2021-08-30 DIAGNOSIS — K5732 Diverticulitis of large intestine without perforation or abscess without bleeding: Secondary | ICD-10-CM | POA: Diagnosis not present

## 2021-08-30 DIAGNOSIS — B009 Herpesviral infection, unspecified: Secondary | ICD-10-CM | POA: Diagnosis not present

## 2021-08-30 DIAGNOSIS — Z09 Encounter for follow-up examination after completed treatment for conditions other than malignant neoplasm: Secondary | ICD-10-CM | POA: Diagnosis not present

## 2021-09-10 ENCOUNTER — Telehealth: Payer: Self-pay | Admitting: Neurology

## 2021-09-10 NOTE — Telephone Encounter (Signed)
Pt called wanting to know when someone will call him with the MRI results from Oct Please advise.

## 2021-09-10 NOTE — Telephone Encounter (Signed)
I spoke to the patient and provided him with the results below (sent through [facility] 08/10/21):  Timothy Stout,   Your Brain MRI is within normal limits. In particular, there were no acute findings, such as a stroke, or mass or blood products. No further action is required on this test at this time. Please keep any upcoming appointments or tests and  call us with any interim questions, concerns, problems or updates. Thanks,    Windell Norfolk, MD

## 2021-09-19 DIAGNOSIS — H25812 Combined forms of age-related cataract, left eye: Secondary | ICD-10-CM | POA: Diagnosis not present

## 2021-10-17 DIAGNOSIS — Z131 Encounter for screening for diabetes mellitus: Secondary | ICD-10-CM | POA: Diagnosis not present

## 2021-10-17 DIAGNOSIS — Z13220 Encounter for screening for lipoid disorders: Secondary | ICD-10-CM | POA: Diagnosis not present

## 2021-10-17 DIAGNOSIS — Z0000 Encounter for general adult medical examination without abnormal findings: Secondary | ICD-10-CM | POA: Diagnosis not present

## 2021-10-24 IMAGING — CT CT ABD-PELV W/O CM
2 of 4 series · 16 of 46 positions shown, 18 images · non-contrast
Comparison: None.

CLINICAL DATA: Left-sided abdominal pain. Diverticulitis suspected.

EXAM:
CT ABDOMEN AND PELVIS WITHOUT CONTRAST
TECHNIQUE: Multidetector CT imaging of the abdomen and pelvis was performed
following the standard protocol without IV contrast.

[Series 2: abd pel wo · axial · 0.73mm/px · z∈[-971,-526]mm · 13 of 99 slices shown, 15 images]
[im 5/99  soft-tissue]
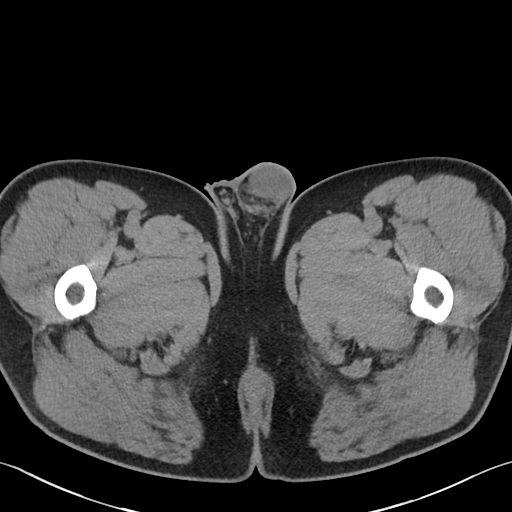
[im 5/99  bone]
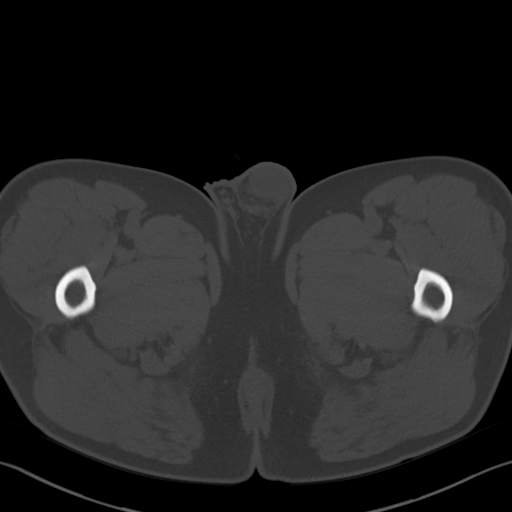
[im 13/99  soft-tissue]
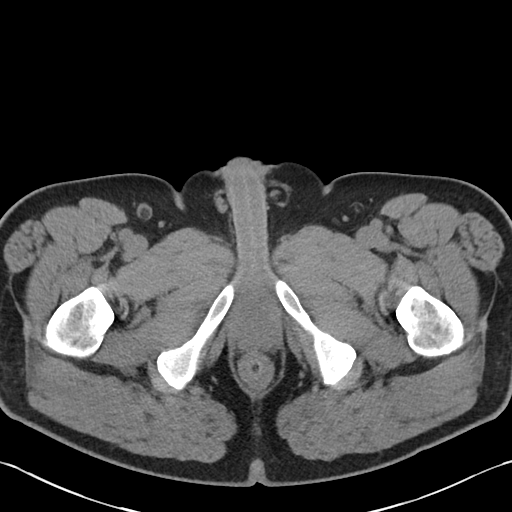
[im 21/99  soft-tissue]
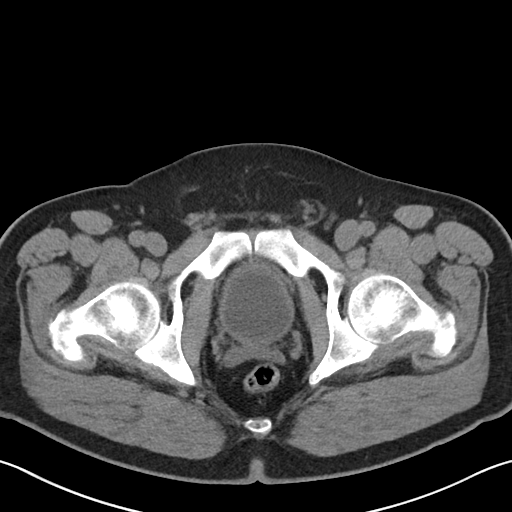
[im 29/99  soft-tissue]
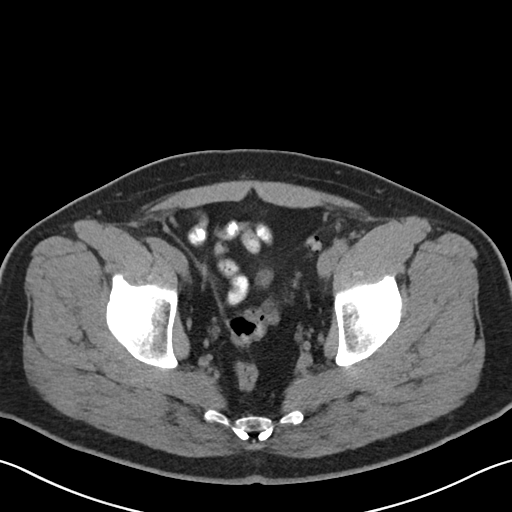
[im 33/99  soft-tissue]
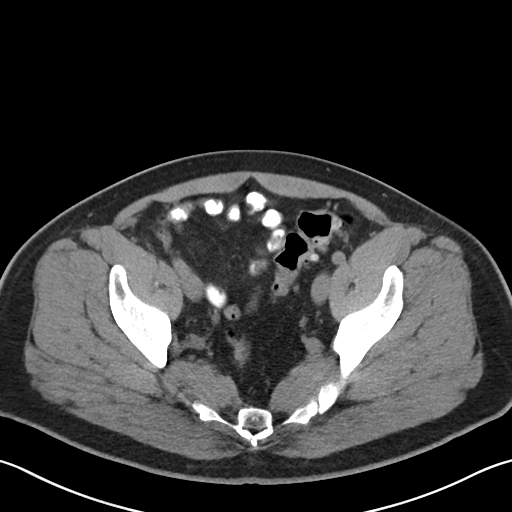
[im 41/99  soft-tissue]
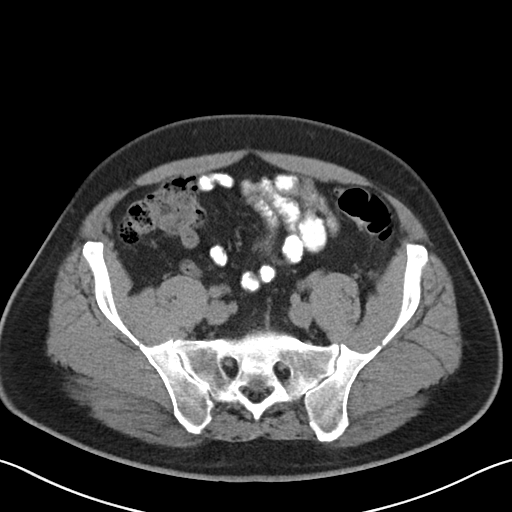
[im 50/99  soft-tissue]
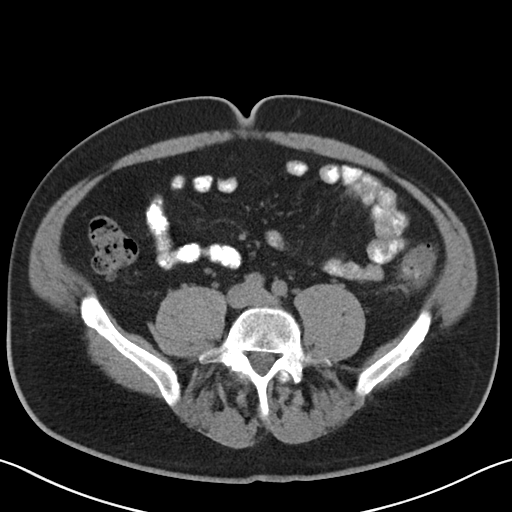
[im 58/99  soft-tissue]
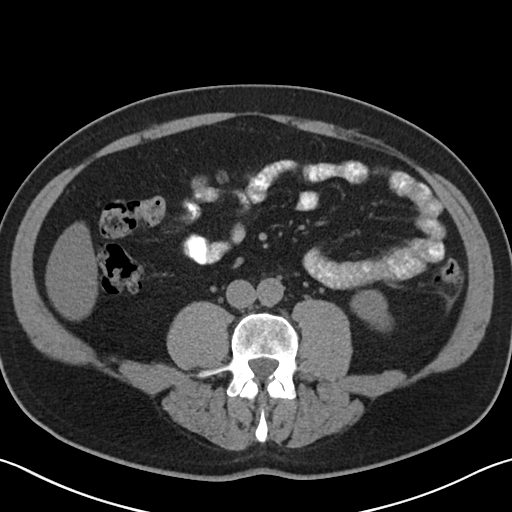
[im 66/99  soft-tissue]
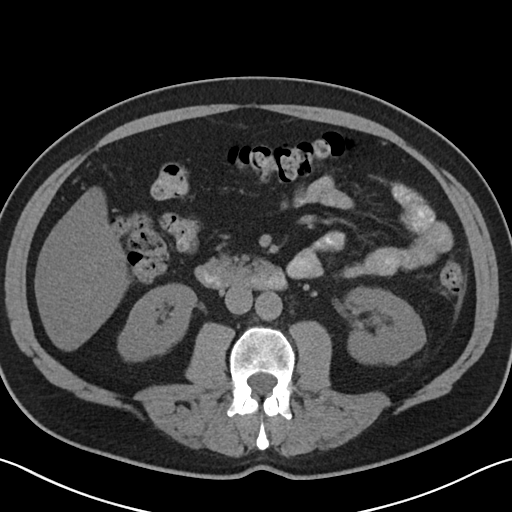
[im 66/99  bone]
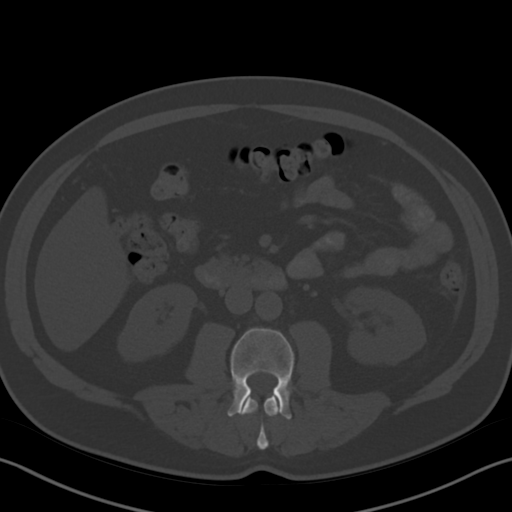
[im 70/99  soft-tissue]
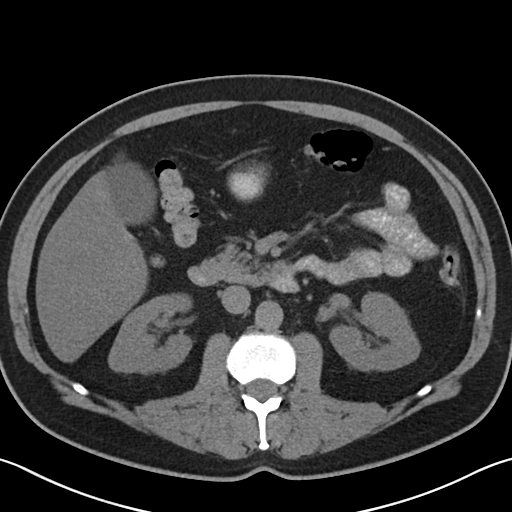
[im 78/99  soft-tissue]
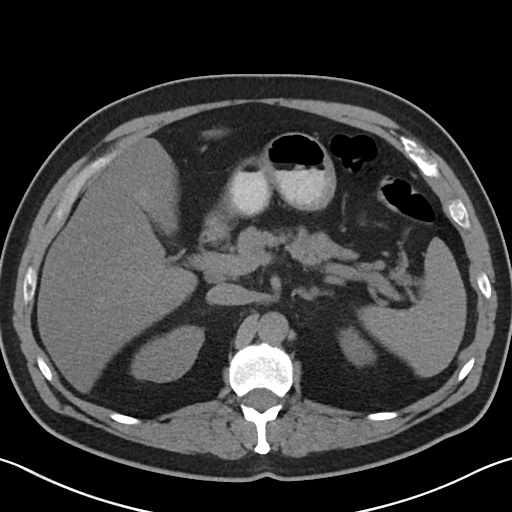
[im 86/99  soft-tissue]
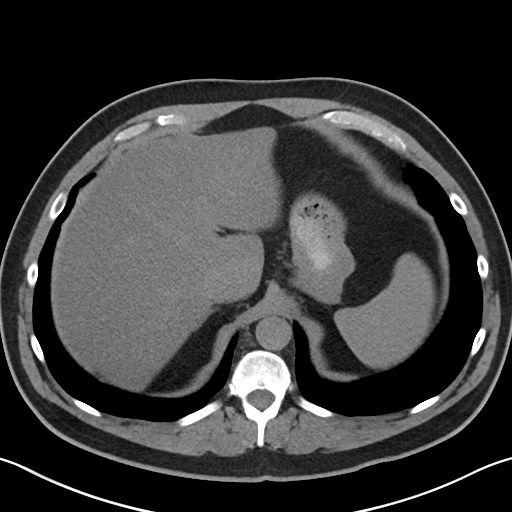
[im 94/99  soft-tissue]
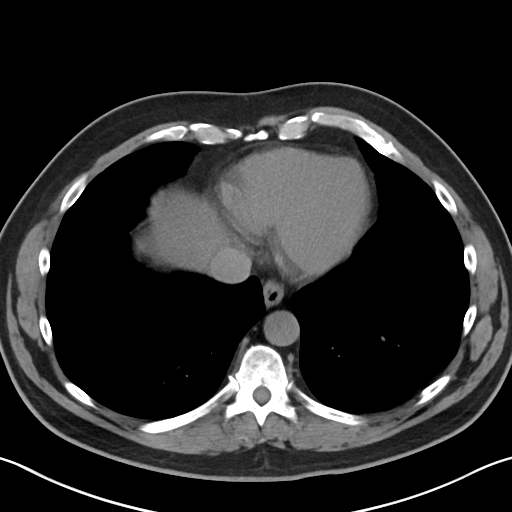

[Series 5: coronal · coronal · 0.77mm/px · 3 of 99 slices shown]
[im 33/99  soft-tissue]
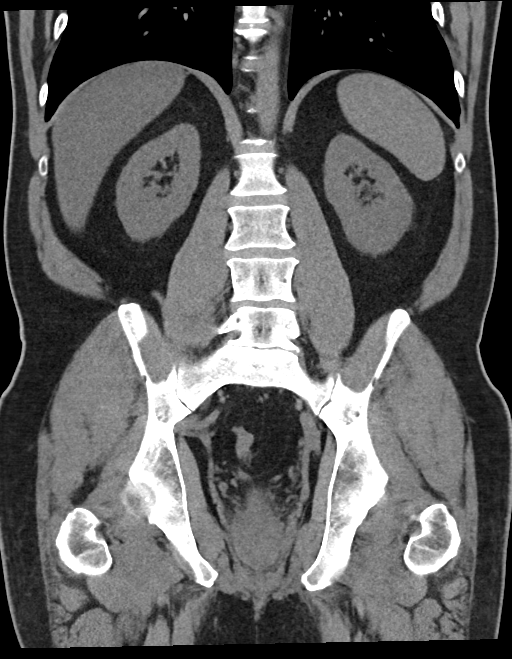
[im 44/99  soft-tissue]
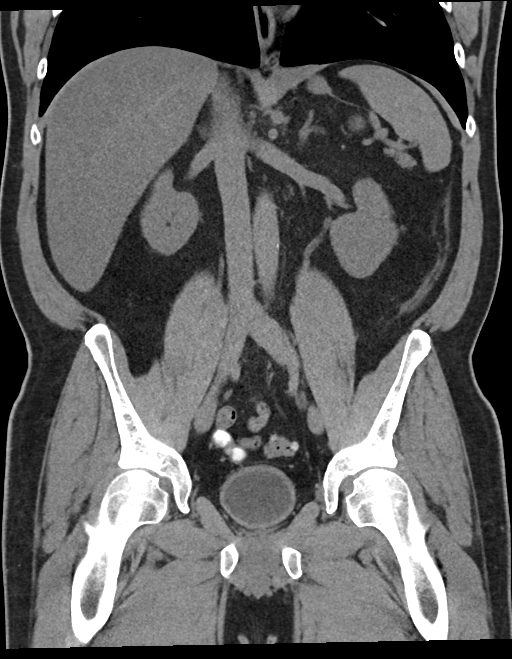
[im 55/99  soft-tissue]
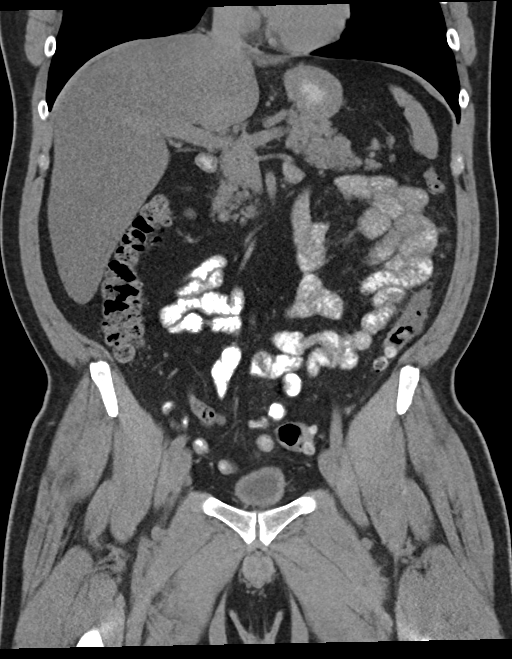

[16 of 46 positions shown; findings below may reference images not displayed]

FINDINGS: Lower chest: No acute airspace disease or pleural effusion. Heart is
normal in size.

Hepatobiliary: Diffuse hepatic steatosis with areas of focal sparing
adjacent to the gallbladder fossa. No evidence of focal liver
lesion. Gallbladder physiologically distended, no calcified stone.
No biliary dilatation.

Pancreas: No ductal dilatation or inflammation.

Spleen: Normal in size without focal abnormality.

Adrenals/Urinary Tract: Normal adrenal glands. No hydronephrosis or
renal calculi. No perinephric edema. No focal renal lesion.
Partially distended urinary bladder without wall thickening or
stone.

Stomach/Bowel: Colonic wall thickening and pericolonic edema
involving the distal descending colon in the region of multiple
diverticula, suspicious for acute diverticulitis. Trace fluid in the
pericolic gutter without focal fluid collection or perforation.
Multiple additional noninflamed diverticula most prominently
affecting the distal colon. Normal small bowel and appendix. Small
hiatal hernia otherwise unremarkable stomach.

Vascular/Lymphatic: Minimal aortic atherosclerosis. No aortic
aneurysm. No bulky abdominopelvic adenopathy.

Reproductive: Prostate is unremarkable.

Other: Fat stranding and trace free fluid in the left pericolic
gutter. No focal fluid collection or free air. No abdominal wall
hernia.

Musculoskeletal: There are no acute or suspicious osseous
abnormalities. Mild L5-S1 degenerative disc disease.
IMPRESSION: 1. Acute uncomplicated diverticulitis of the distal descending
colon. No perforation or abscess.
2. Hepatic steatosis.
3. Small hiatal hernia.

Aortic Atherosclerosis ([id number].O).

## 2021-12-17 DIAGNOSIS — R21 Rash and other nonspecific skin eruption: Secondary | ICD-10-CM | POA: Diagnosis not present

## 2022-01-16 DIAGNOSIS — M5412 Radiculopathy, cervical region: Secondary | ICD-10-CM | POA: Diagnosis not present

## 2022-01-16 DIAGNOSIS — M542 Cervicalgia: Secondary | ICD-10-CM | POA: Diagnosis not present

## 2022-01-31 ENCOUNTER — Ambulatory Visit: Payer: BC Managed Care – PPO | Admitting: Neurology

## 2022-02-15 DIAGNOSIS — M5136 Other intervertebral disc degeneration, lumbar region: Secondary | ICD-10-CM | POA: Diagnosis not present

## 2022-02-18 ENCOUNTER — Encounter: Payer: Self-pay | Admitting: Neurology

## 2022-02-18 ENCOUNTER — Telehealth (INDEPENDENT_AMBULATORY_CARE_PROVIDER_SITE_OTHER): Payer: BC Managed Care – PPO | Admitting: Neurology

## 2022-02-18 DIAGNOSIS — G43109 Migraine with aura, not intractable, without status migrainosus: Secondary | ICD-10-CM | POA: Diagnosis not present

## 2022-02-18 NOTE — Patient Instructions (Signed)
Continue current migraines regiment  ? Magnesium 400 mg nightly  ? Riboflavin 400 mg nightly  ? CoQ10 100 mg nighty ?Continue with Maxalt as needed  ?Return in 6 months or sooner if worse  ?

## 2022-02-18 NOTE — Progress Notes (Signed)
? ?[facility] ? ?PATIENT: Timothy Stout ?DOB: 1971/06/26 ? ?REFERRING CLINICIAN: Joycelyn RuaMeyers, Stephen, MD ?HISTORY FROM: Patient  ?REASON FOR VISIT: Migraine Headache ? ? ?HISTORICAL ? ?CHIEF COMPLAINT:  ?Chief Complaint  ?Patient presents with  ? Migraines headaches follow up  ? ? ?INTERVAL HISTORY 02/18/22:  ?Patient was called via video visit. He reports that migraine frequency is better than last visit, he is taking the CoQ10 and Riboflavin, and Magnesuim, reports good control of his headaches. ?Had a couple of headaches int he first month, then migraine in April 5 and 14, but it was toward the end of tax season, He is a [employer]. Reports no migraines since April 14.  ?When he had the migraines in April, it started the same as previous, took Maxalt and his eyesight was back to normal after 30 min to an hour.  ? ? ?HISTORY OF PRESENT ILLNESS:  ?This is a 51 year old gentleman with past medical history of migraine headaches (diagnosed since high school), recurrent diverticulitis who is presenting for management of his migraine.  Patient stated that in the past he used to have migraine once every 463-months and was put on magnesium supplement and at that time, then frequency improved to 1-2 migraine per year.  He is described migraine with aura, describes aura as loss of peripheral vision mostly on the left prior to start of headache.  Usually when she gets the aura patient will take Maxalt and it will abort the episode without any development of headaches and his vision will come back to normal.  After each episode he feels drained and tired.  Patient said for the past few month he has been having increase in Migraines, currently he is having 1 migraine every 2 weeks.  Again his migraines are well controlled with the Maxalt.  Denies any nausea, denies any vomiting, denies any photophobia or phonophobia.  He reported in the past he used to have photophobia but again Maxalt to abort the episode  prior to the start of the headache.  When he does have the headaches he described as diffuse throbbing pain.  ?He has a strong family history of migraine including father and all 4 children's.  ? ? ?Headache History and Characteristics: ?Onset: Since high school,  ?Location: Diffuse pain  ?Quality: Throbbing.  ?Intensity:2-3 /10.  ?Duration: half hour to an hour  ?Migrainous Features: None  ?Aura: Yes, left field visual cut  ?History of brain injury or tumor: No ? ?Family history: Father, all 4 kids with migraines  ?Motion sickness: no ?Cardiac history: no ? ?ONG:[id number]:Ibuprofen, occasionally  ?Caffeine: Yes, daily coffee ?Sleep: very good  ?Mood/ Stress: Work related stress  ? ?Prior prophylaxis: ?Propranolol: No  ?Verapamil:No ?TCA: No ?Topamax: No ?Depakote: No ?Effexor: No ?Cymbalta: No ?Neurontin:No ? ?Prior abortives: ?Triptan: Yes, works well  ?Anti-emetic: No ?Steroids: No ?Ergotamine suppository: No ? ? ?OTHER MEDICAL CONDITIONS: Diverticulitis  ? ? ?REVIEW OF SYSTEMS: Full 14 system review of systems performed and negative with exception of: as needed  ? ?ALLERGIES: ?No Known Allergies ? ?HOME MEDICATIONS: ?Outpatient Medications Prior to Visit  ?Medication Sig Dispense Refill  ? Multiple Vitamin (MULTIVITAMIN ADULT PO) Take 1 tablet by mouth daily.    ? rizatriptan (MAXALT) 10 MG tablet Take by mouth.    ? ?No facility-administered medications prior to visit.  ? ? ?PAST MEDICAL HISTORY: ?Past Medical History:  ?Diagnosis Date  ? Abnormal LFTs   ? Acute hepatitis   ? Diverticulitis   ? GERD (  gastroesophageal reflux disease)   ? Hyperlipidemia   ? Migraine   ? OSA (obstructive sleep apnea)   ? MILD  ? Rotator cuff tear, right   ? Thumb weakness   ? LEFT  ? ? ?PAST SURGICAL HISTORY: ?Past Surgical History:  ?Procedure Laterality Date  ? INGUINAL HERNIA REPAIR  1993  ? KNEE ARTHROSCOPY  09-06-2009  ? LEFT KNEE  ? LUMBAR LAMINECTOMY/DECOMPRESSION MICRODISCECTOMY  02-17-2001  ? L5 - S1  ? RIGHT KNEE ARTHROSCOPY  1999   ? SHOULDER ARTHROSCOPY WITH LABRAL REPAIR  08/19/2012  ? Procedure: SHOULDER ARTHROSCOPY WITH LABRAL REPAIR;  Surgeon: Drucilla Schmidt, MD;  Location: [facility];  Service: Orthopedics;  Laterality: Right;  shaving of rotaor cuff  ? SHOULDER ARTHROSCOPY WITH SUBACROMIAL DECOMPRESSION  08/19/2012  ? Procedure: SHOULDER ARTHROSCOPY WITH SUBACROMIAL DECOMPRESSION;  Surgeon: Drucilla Schmidt, MD;  Location: [facility];  Service: Orthopedics;  Laterality: Right;  ? ? ?FAMILY HISTORY: ?Family History  ?Problem Relation Age of Onset  ? Lupus Mother   ? Heart failure Mother   ? Alzheimer's disease Father   ? Prostate cancer Father   ? COPD Father   ? Diabetes Father   ? Cancer - Colon Father   ? Heart attack Maternal Grandmother   ? ? ?SOCIAL HISTORY: ?Social History  ? ?Socioeconomic History  ? Marital status: Married  ?  Spouse name: Not on file  ? Number of children: Not on file  ? Years of education: Not on file  ? Highest education level: Not on file  ?Occupational History  ? Not on file  ?Tobacco Use  ? Smoking status: Never  ? Smokeless tobacco: Never  ?Substance and Sexual Activity  ? Alcohol use: Yes  ?  Comment: OCCASIONAL  ? Drug use: No  ? Sexual activity: Not Currently  ?Other Topics Concern  ? Not on file  ?Social History Narrative  ? Not on file  ? ?Social Determinants of Health  ? ?Financial Resource Strain: Not on file  ?Food Insecurity: Not on file  ?Transportation Needs: Not on file  ?Physical Activity: Not on file  ?Stress: Not on file  ?Social Connections: Not on file  ?Intimate Partner Violence: Not on file  ? ? ? ?PHYSICAL EXAM ? ?GENERAL EXAM/CONSTITUTIONAL: ?Vitals:  ?There were no vitals filed for this visit. ? ?There is no height or weight on file to calculate BMI. ?Wt Readings from Last 3 Encounters:  ?07/30/21 205 lb (93 kg)  ?11/03/20 210 lb (95.3 kg)  ?10/28/17 219 lb 12.8 oz (99.7 kg)  ? ?Patient is in no distress; well developed, nourished and groomed; neck  is supple ? ? ?NEUROLOGIC: ?MENTAL STATUS:  ?   ? View : No data to display.  ?  ?  ?  ? ?awake, alert, oriented to person, place and time ?recent and remote memory intact ?normal attention and concentration ?language fluent, comprehension intact, naming intact ?fund of knowledge appropriate ? ? ? ? ? ?DIAGNOSTIC DATA (LABS, IMAGING, TESTING) ?- I reviewed patient records, labs, notes, testing and imaging myself where available. ? ?Lab Results  ?Component Value Date  ? WBC 10.6 (H) 03/15/2021  ? HGB 16.1 03/15/2021  ? HCT 45.6 03/15/2021  ? MCV 88.5 03/15/2021  ? PLT 190 03/15/2021  ? ?   ?Component Value Date/Time  ? NA 137 03/15/2021 1740  ? K 3.9 03/15/2021 1740  ? CL 100 03/15/2021 1740  ? CO2 30 03/15/2021 1740  ? GLUCOSE  92 03/15/2021 1740  ? BUN 17 03/15/2021 1740  ? CREATININE 0.98 03/15/2021 1740  ? CALCIUM 9.3 03/15/2021 1740  ? PROT 7.7 03/15/2021 1740  ? ALBUMIN 4.6 03/15/2021 1740  ? AST 30 03/15/2021 1740  ? ALT 56 (H) 03/15/2021 1740  ? ALKPHOS 48 03/15/2021 1740  ? BILITOT 0.5 03/15/2021 1740  ? GFRNONAA >60 03/15/2021 1740  ? GFRAA >90 02/22/2013 0048  ? ?No results found for: CHOL, HDL, LDLCALC, LDLDIRECT, TRIG, CHOLHDL ?No results found for: HGBA1C ?No results found for: VITAMINB12 ?No results found for: TSH ? ? ?ASSESSMENT AND PLAN ? ?51 y.o. year old male with past medical history of recurrent diverticulitis and migraine with aura who is presenting to establish care and management of his migraine headaches.  He reports improvement of the migraines with a combination of Magnesium, Riboflavin and CoQ10. He does take Maxalt as needed for the headaches with good improvement. At this time, we will continue the same regiment and see him in 6 months for follow up. Return sooner if worse.  ? ? ?1. Migraine with aura and without status migrainosus, not intractable   ? ? ?Patient Instructions  ?Continue current migraines regiment  ? Magnesium 400 mg nightly  ? Riboflavin 400 mg nightly  ? CoQ10 100 mg  nighty ?Continue with Maxalt as needed  ?Return in 6 months or sooner if worse  ? ? ? ?No orders of the defined types were placed in this encounter. ? ? ?No orders of the defined types were placed in th

## 2022-03-11 ENCOUNTER — Encounter: Payer: Self-pay | Admitting: Cardiovascular Disease

## 2022-03-11 NOTE — Progress Notes (Unsigned)
Cardiology Office Note:    Date:  03/12/2022   ID:  Timothy Stout, DOB 21-Sep-1971, MRN [id number]  PCP:  Joycelyn RuaMeyers, Stephen, MD  Cardiologist:   Kiah Keay  Referring MD: Rosana BergerSteve Meyers, MD    Problem List 1. Atypical CP 2. Migraine headaches  3. Arthroscopic knee surgery .  4.  Obstructive sleep apnea - borderline    Chief Complaint  Patient presents with   Shortness of Breath    Previous notes from 2019    Timothy Stout is a 51 y.o. male with a hx of chest discomfort in the past.  We are asked to see him today by Dr. Royetta AsalSteve Myers for some recurrent episodes of chest discomfort.  Timothy Stout has had episodic chest pain for the past couple of years.  He has seen Dr. Mayford Knifeurner in the past.  He had a normal stress test.  He is continued to have intermittent episodes of chest discomfort.  These seem to be related to the foods that he might eat.  The pain seems to be located towards the right or towards the left side of his chest.     the CP is not exertional. Feels like MSK pain.   Has been back on the Elliptical and he is working out regularly without any CP.  Lifted weights without any issues. Last night, he worked out - HR of 171   , had no CP  No dyspnea, no syncope or presyncope. , no PND or orthopnea.   [employer] is [employer].    Jan. 21, 2022: Timothy Stout is seen today for follow up of his atypical chest pain .  He was seen by Lizabeth LeydenNina Hammond, NP in Jan. 2021 - virtual visit  He continues to have atypical CP  Coronary calcium score from Feb. 2021 showed:  Coronary calcium score of 0. This was 0 percentile for age and sex matched control.  Has noticed a bit more fatigue than usual. Stopped working out for a while. Still does the elliptical  sporatically now - used to exercise regularly , but notices that he is more fatigued than he thought he should have been ( more dyspnea while carrying in wood )  Is intermittant  Wears his HR monitor with exercise,  Gets his HR 155-160 at  peak exercise  Sleeping ok, eating ok Had Covid in Aug.  Had fever for 12 days    Mar 12, 2022 Timothy Stout is seen today for follow up of his atypical CP and dyspnea  Has a pinched nerve in his neck.   Causing some radiaton of pain down his left shoulder on occasion Better on NSAIDs [place]. No Cp or DOE    Past Medical History:  Diagnosis Date   Abnormal LFTs    Acute hepatitis    Diverticulitis    GERD (gastroesophageal reflux disease)    Hyperlipidemia    Migraine    OSA (obstructive sleep apnea)    MILD   Rotator cuff tear, right    Thumb weakness    LEFT    Past Surgical History:  Procedure Laterality Date   INGUINAL HERNIA REPAIR  1993   KNEE ARTHROSCOPY  09-06-2009   LEFT KNEE   LUMBAR LAMINECTOMY/DECOMPRESSION MICRODISCECTOMY  02-17-2001   L5 - S1   RIGHT KNEE ARTHROSCOPY  1999   SHOULDER ARTHROSCOPY WITH LABRAL REPAIR  08/19/2012   Procedure: SHOULDER ARTHROSCOPY WITH LABRAL REPAIR;  Surgeon: Drucilla SchmidtJames P Aplington, MD;  [facility];  Service: Orthopedics;  Laterality: Right;  shaving of rotaor cuff   SHOULDER ARTHROSCOPY WITH SUBACROMIAL DECOMPRESSION  08/19/2012   Procedure: SHOULDER ARTHROSCOPY WITH SUBACROMIAL DECOMPRESSION;  Surgeon: Drucilla Schmidt, MD;  Location: [facility];  Service: Orthopedics;  Laterality: Right;    Current Medications: Current Meds  Medication Sig   Coenzyme Q10 (CO Q 10) 100 MG CAPS Take 1 tablet by mouth See admin instructions.   magnesium oxide (MAG-OX) 400 MG tablet Take 1 tablet by mouth daily in the afternoon.   meloxicam (MOBIC) 15 MG tablet Take 15 mg by mouth daily.   Multiple Vitamin (MULTIVITAMIN ADULT PO) Take 1 tablet by mouth daily.   RIBOFLAVIN PO Take 1 tablet by mouth daily.   rizatriptan (MAXALT) 10 MG tablet Take by mouth.   valACYclovir (VALTREX) 1000 MG tablet Take 1,000 mg by mouth 2 (two) times daily as needed.   VITAMIN D PO Take 1 tablet by  mouth daily in the afternoon.     Allergies:   Lidocaine   Social History   Socioeconomic History   Marital status: Married    Spouse name: Not on file   Number of children: Not on file   Years of education: Not on file   Highest education level: Not on file  Occupational History   Not on file  Tobacco Use   Smoking status: Never   Smokeless tobacco: Never  Substance and Sexual Activity   Alcohol use: Yes    Comment: OCCASIONAL   Drug use: No   Sexual activity: Not Currently  Other Topics Concern   Not on file  Social History Narrative   Not on file   Social Determinants of Health   Financial Resource Strain: Not on file  Food Insecurity: Not on file  Transportation Needs: Not on file  Physical Activity: Not on file  Stress: Not on file  Social Connections: Not on file     Family History: The patient's family history includes Alzheimer's disease in his father; COPD in his father; Cancer - Colon in his father; Diabetes in his father; Heart attack in his maternal grandmother; Heart failure in his mother; Lupus in his mother; Prostate cancer in his father.  ROS:   Please see the history of present illness.     All other systems reviewed and are negative.  EKGs/Labs/Other Studies Reviewed:    The following studies were reviewed today:     Recent Labs: 03/15/2021: ALT 56; BUN 17; Creatinine, Ser 0.98; Hemoglobin 16.1; Platelets 190; Potassium 3.9; Sodium 137  Recent Lipid Panel No results found for: CHOL, TRIG, HDL, CHOLHDL, VLDL, LDLCALC, LDLDIRECT  Physical Exam:    Physical Exam: Blood pressure (!) 142/88, pulse 94, height 5' 10.5" (1.791 m), weight 214 lb 6.4 oz (97.3 kg), SpO2 96 %.  GEN:  Well nourished, well developed in no acute distress HEENT: Normal NECK: No JVD; No carotid bruits LYMPHATICS: No lymphadenopathy CARDIAC: RRR , no murmurs, rubs, gallops RESPIRATORY:  Clear to auscultation without rales, wheezing or rhonchi  ABDOMEN: Soft,  non-tender, non-distended MUSCULOSKELETAL:  No edema; No deformity  SKIN: Warm and dry NEUROLOGIC:  Alert and oriented x 3   EKG:    Mar 12, 2022: Normal sinus rhythm with sinus arrhythmia.  Right axis deviation.  He has early repolarization.   ASSESSMENT:    1. Atypical chest pain     PLAN:     Atypical chest pain:   doing well .  No further episodes of CP or DOE.  Has been walking a bit without any issues    2.  Dyspnea on exertion:  better     2.  Mild hyperlipidemia:   Managed by his primary md   Medication Adjustments/Labs and Tests Ordered: Current medicines are reviewed at length with the patient today.  Concerns regarding medicines are outlined above.  Orders Placed This Encounter  Procedures   EKG 12-Lead   No orders of the defined types were placed in this encounter.  Will have him follow up as needed.     Signed, Kristeen Miss, MD  03/12/2022 10:52 AM    [facility]

## 2022-03-12 ENCOUNTER — Ambulatory Visit (INDEPENDENT_AMBULATORY_CARE_PROVIDER_SITE_OTHER): Payer: BC Managed Care – PPO | Admitting: Cardiovascular Disease

## 2022-03-12 ENCOUNTER — Encounter: Payer: Self-pay | Admitting: Cardiovascular Disease

## 2022-03-12 VITALS — BP 142/88 | HR 94 | Ht 70.5 in | Wt 214.4 lb

## 2022-03-12 DIAGNOSIS — R0789 Other chest pain: Secondary | ICD-10-CM

## 2022-03-12 NOTE — Patient Instructions (Signed)
Medication Instructions:  Your physician recommends that you continue on your current medications as directed. Please refer to the Current Medication list given to you today.  *If you need a refill on your cardiac medications before your next appointment, please call your pharmacy*   Lab Work: NONE If you have labs (blood work) drawn today and your tests are completely normal, you will receive your results only by: MyChart Message (if you have MyChart) OR A paper copy in the mail If you have any lab test that is abnormal or we need to change your treatment, we will call you to review the results.   Testing/Procedures: NONE   Follow-Up: At [facility], you and your health needs are our priority.  As part of our continuing mission to provide you with exceptional heart care, we have created designated Provider Care Teams.  These Care Teams include your primary Cardiologist (physician) and Advanced Practice Providers (APPs -  Physician Assistants and Nurse Practitioners) who all work together to provide you with the care you need, when you need it.  Your next appointment:   As Needed  The format for your next appointment:   In Person  Provider:   Philip Nahser, MD    Important Information About Sugar       

## 2022-05-22 DIAGNOSIS — G959 Disease of spinal cord, unspecified: Secondary | ICD-10-CM | POA: Diagnosis not present

## 2022-05-22 DIAGNOSIS — M5412 Radiculopathy, cervical region: Secondary | ICD-10-CM | POA: Diagnosis not present

## 2022-05-22 DIAGNOSIS — Z6831 Body mass index (BMI) 31.0-31.9, adult: Secondary | ICD-10-CM | POA: Diagnosis not present

## 2022-05-24 DIAGNOSIS — M5412 Radiculopathy, cervical region: Secondary | ICD-10-CM | POA: Diagnosis not present

## 2022-05-29 DIAGNOSIS — M5412 Radiculopathy, cervical region: Secondary | ICD-10-CM | POA: Diagnosis not present

## 2022-06-04 DIAGNOSIS — M4312 Spondylolisthesis, cervical region: Secondary | ICD-10-CM | POA: Diagnosis not present

## 2022-06-04 DIAGNOSIS — M542 Cervicalgia: Secondary | ICD-10-CM | POA: Diagnosis not present

## 2022-06-04 DIAGNOSIS — G959 Disease of spinal cord, unspecified: Secondary | ICD-10-CM | POA: Diagnosis not present

## 2022-06-07 DIAGNOSIS — M5412 Radiculopathy, cervical region: Secondary | ICD-10-CM | POA: Diagnosis not present

## 2022-06-14 DIAGNOSIS — M5412 Radiculopathy, cervical region: Secondary | ICD-10-CM | POA: Diagnosis not present

## 2022-06-21 DIAGNOSIS — M5412 Radiculopathy, cervical region: Secondary | ICD-10-CM | POA: Diagnosis not present

## 2022-07-02 DIAGNOSIS — M5412 Radiculopathy, cervical region: Secondary | ICD-10-CM | POA: Diagnosis not present

## 2022-08-08 DIAGNOSIS — D225 Melanocytic nevi of trunk: Secondary | ICD-10-CM | POA: Diagnosis not present

## 2022-08-08 DIAGNOSIS — L570 Actinic keratosis: Secondary | ICD-10-CM | POA: Diagnosis not present

## 2022-08-08 DIAGNOSIS — L821 Other seborrheic keratosis: Secondary | ICD-10-CM | POA: Diagnosis not present

## 2022-10-21 DIAGNOSIS — Z13220 Encounter for screening for lipoid disorders: Secondary | ICD-10-CM | POA: Diagnosis not present

## 2022-10-21 DIAGNOSIS — R7303 Prediabetes: Secondary | ICD-10-CM | POA: Diagnosis not present

## 2022-10-21 DIAGNOSIS — Z0000 Encounter for general adult medical examination without abnormal findings: Secondary | ICD-10-CM | POA: Diagnosis not present

## 2022-10-27 DIAGNOSIS — J019 Acute sinusitis, unspecified: Secondary | ICD-10-CM | POA: Diagnosis not present

## 2022-10-27 DIAGNOSIS — R0981 Nasal congestion: Secondary | ICD-10-CM | POA: Diagnosis not present

## 2022-10-27 DIAGNOSIS — R059 Cough, unspecified: Secondary | ICD-10-CM | POA: Diagnosis not present

## 2022-12-01 ENCOUNTER — Other Ambulatory Visit: Payer: Self-pay

## 2022-12-01 ENCOUNTER — Emergency Department (HOSPITAL_COMMUNITY)
Admission: EM | Admit: 2022-12-01 | Discharge: 2022-12-01 | Disposition: A | Discharge status: Home / Self Care | Payer: BC Managed Care – PPO | Attending: Emergency Medicine | Admitting: Emergency Medicine

## 2022-12-01 ENCOUNTER — Encounter (HOSPITAL_COMMUNITY): Payer: Self-pay

## 2022-12-01 ENCOUNTER — Emergency Department (HOSPITAL_COMMUNITY): Payer: BC Managed Care – PPO

## 2022-12-01 DIAGNOSIS — R000 Tachycardia, unspecified: Secondary | ICD-10-CM | POA: Diagnosis not present

## 2022-12-01 DIAGNOSIS — R61 Generalized hyperhidrosis: Secondary | ICD-10-CM | POA: Diagnosis not present

## 2022-12-01 DIAGNOSIS — R079 Chest pain, unspecified: Secondary | ICD-10-CM | POA: Diagnosis not present

## 2022-12-01 DIAGNOSIS — R002 Palpitations: Secondary | ICD-10-CM | POA: Diagnosis not present

## 2022-12-01 DIAGNOSIS — R1030 Lower abdominal pain, unspecified: Secondary | ICD-10-CM | POA: Insufficient documentation

## 2022-12-01 DIAGNOSIS — R0981 Nasal congestion: Secondary | ICD-10-CM | POA: Insufficient documentation

## 2022-12-01 DIAGNOSIS — R42 Dizziness and giddiness: Secondary | ICD-10-CM | POA: Diagnosis not present

## 2022-12-01 DIAGNOSIS — R12 Heartburn: Secondary | ICD-10-CM

## 2022-12-01 DIAGNOSIS — I10 Essential (primary) hypertension: Secondary | ICD-10-CM | POA: Diagnosis not present

## 2022-12-01 LAB — CBC
HCT: 51.2 % (ref 39.0–52.0)
Hemoglobin: 17.8 g/dL — ABNORMAL HIGH (ref 13.0–17.0)
MCH: 31.1 pg (ref 26.0–34.0)
MCHC: 34.8 g/dL (ref 30.0–36.0)
MCV: 89.5 fL (ref 80.0–100.0)
Platelets: 188 10*3/uL (ref 150–400)
RBC: 5.72 MIL/uL (ref 4.22–5.81)
RDW: 12.1 % (ref 11.5–15.5)
WBC: 6.4 10*3/uL (ref 4.0–10.5)
nRBC: 0 % (ref 0.0–0.2)

## 2022-12-01 LAB — TROPONIN I (HIGH SENSITIVITY)
Troponin I (High Sensitivity): 3 ng/L (ref <18)
Troponin I (High Sensitivity): <2 ng/L (ref <18)

## 2022-12-01 LAB — COMPREHENSIVE METABOLIC PANEL
ALT: 59 U/L — ABNORMAL HIGH (ref 0–44)
AST: 42 U/L — ABNORMAL HIGH (ref 15–41)
Albumin: 4.6 g/dL (ref 3.5–5.0)
Alkaline Phosphatase: 58 U/L (ref 38–126)
Anion gap: 12 (ref 5–15)
BUN: 12 mg/dL (ref 6–20)
CO2: 25 mmol/L (ref 22–32)
Calcium: 9.5 mg/dL (ref 8.9–10.3)
Chloride: 101 mmol/L (ref 98–111)
Creatinine, Ser: 1.09 mg/dL (ref 0.61–1.24)
GFR, Estimated: >60 mL/min (ref >60)
Glucose, Bld: 117 mg/dL — ABNORMAL HIGH (ref 70–99)
Potassium: 4 mmol/L (ref 3.5–5.1)
Sodium: 138 mmol/L (ref 135–145)
Total Bilirubin: 0.7 mg/dL (ref 0.3–1.2)
Total Protein: 7.6 g/dL (ref 6.5–8.1)

## 2022-12-01 LAB — LIPASE, BLOOD: Lipase: 52 U/L — ABNORMAL HIGH (ref 11–51)

## 2022-12-01 MED ORDER — SODIUM CHLORIDE 0.9 % IV BOLUS
1000.0000 mL | Freq: Once | INTRAVENOUS | Status: AC
  Administered 2022-12-01: 1000 mL via INTRAVENOUS

## 2022-12-01 NOTE — ED Provider Notes (Signed)
[facility] Emergency Department Provider Note MRN:  [id number]  Arrival date & time: 12/01/22     Chief Complaint   Tachycardia History of Present Illness   Timothy Stout is a 52 y.o. year-old male with a history of hyperlipidemia presenting to the ED with chief complaint of tachycardia.  Patient woke up feeling like his heart was racing, some palpitations.  Checked on his Apple Watch and found his heart rate between 110 and 120.  Felt lightheaded, felt some discomfort in the chest and lower abdomen described as indigestion or heartburn.  Here for evaluation.  Review of Systems  A thorough review of systems was obtained and all systems are negative except as noted in the HPI and PMH.   Patient's Health History    Past Medical History:  Diagnosis Date   Abnormal LFTs    Acute hepatitis    Diverticulitis    GERD (gastroesophageal reflux disease)    Hyperlipidemia    Migraine    OSA (obstructive sleep apnea)    MILD   Rotator cuff tear, right    Thumb weakness    LEFT    Past Surgical History:  Procedure Laterality Date   INGUINAL HERNIA REPAIR  1993   KNEE ARTHROSCOPY  09-06-2009   LEFT KNEE   LUMBAR LAMINECTOMY/DECOMPRESSION MICRODISCECTOMY  02-17-2001   L5 - S1   RIGHT KNEE ARTHROSCOPY  1999   SHOULDER ARTHROSCOPY WITH LABRAL REPAIR  08/19/2012   Procedure: SHOULDER ARTHROSCOPY WITH LABRAL REPAIR;  Surgeon: Magnus Sinning, MD;  Location: [place];  Service: Orthopedics;  Laterality: Right;  shaving of rotaor cuff   SHOULDER ARTHROSCOPY WITH SUBACROMIAL DECOMPRESSION  08/19/2012   Procedure: SHOULDER ARTHROSCOPY WITH SUBACROMIAL DECOMPRESSION;  Surgeon: Magnus Sinning, MD;  Location: [place];  Service: Orthopedics;  Laterality: Right;    Family History  Problem Relation Age of Onset   Lupus Mother    Heart failure Mother    Alzheimer's disease Father    Prostate cancer Father    COPD Father     Diabetes Father    Cancer - Colon Father    Heart attack Maternal Grandmother     Social History   Socioeconomic History   Marital status: Married    Spouse name: Not on file   Number of children: Not on file   Years of education: Not on file   Highest education level: Not on file  Occupational History   Not on file  Tobacco Use   Smoking status: Never   Smokeless tobacco: Never  Substance and Sexual Activity   Alcohol use: Yes    Comment: OCCASIONAL   Drug use: No   Sexual activity: Not Currently  Other Topics Concern   Not on file  Social History Narrative   Not on file   Social Determinants of Health   Financial Resource Strain: Not on file  Food Insecurity: Not on file  Transportation Needs: Not on file  Physical Activity: Not on file  Stress: Not on file  Social Connections: Not on file  Intimate Partner Violence: Not on file     Physical Exam   Vitals:   12/01/22 0600 12/01/22 0700  BP: 139/83 135/86  Pulse: 89 95  Resp: 14 12  Temp:    SpO2: 100% 99%    CONSTITUTIONAL: Well-appearing, NAD NEURO/PSYCH:  Alert and oriented x 3, no focal deficits EYES:  eyes equal and reactive ENT/NECK:  no LAD, no JVD  CARDIO: Regular rate, well-perfused, normal S1 and S2 PULM:  CTAB no wheezing or rhonchi GI/GU:  non-distended, non-tender MSK/SPINE:  No gross deformities, no edema SKIN:  no rash, atraumatic   *Additional and/or pertinent findings included in MDM below  Diagnostic and Interventional Summary    EKG Interpretation  Date/Time:  Sunday December 01 2022 05:04:16 EST Ventricular Rate:  88 PR Interval:  171 QRS Duration: 104 QT Interval:  384 QTC Calculation: 465 R Axis:   100 Text Interpretation: Sinus rhythm Right axis deviation Confirmed by Gerlene Fee [id number]) on 12/01/2022 5:18:27 AM       Labs Reviewed  CBC - Abnormal; Notable for the following components:      Result Value   Hemoglobin 17.8 (*)    All other components within  normal limits  COMPREHENSIVE METABOLIC PANEL - Abnormal; Notable for the following components:   Glucose, Bld 117 (*)    AST 42 (*)    ALT 59 (*)    All other components within normal limits  LIPASE, BLOOD - Abnormal; Notable for the following components:   Lipase 52 (*)    All other components within normal limits  TROPONIN I (HIGH SENSITIVITY)  TROPONIN I (HIGH SENSITIVITY)    DG Chest Port 1 View  Final Result      Medications  sodium chloride 0.9 % bolus 1,000 mL (0 mLs Intravenous Stopped 12/01/22 0706)     Procedures  /  Critical Care Procedures  ED Course and Medical Decision Making  Initial Impression and Ddx Differential diagnosis includes ACS, dehydration, doubt PE, doubt dissection.  Denies any chest pain or shortness of breath, no leg pain or swelling.  Sitting comfortably at this time.  Past medical/surgical history that increases complexity of ED encounter: None  Interpretation of Diagnostics I personally reviewed the EKG and my interpretation is as follows: Sinus rhythm without concerning ischemic features  Labs reassuring with no significant blood count or electrolyte disturbance, first troponin negative  Patient Reassessment and Ultimate Disposition/Management     Providing fluids, will reassess after second troponin, anticipating discharge if negative.  Signed out to oncoming provider at shift change.  Patient management required discussion with the following services or consulting groups:  None  Complexity of Problems Addressed Acute illness or injury that poses threat of life of bodily function  Additional Data Reviewed and Analyzed Further history obtained from: None  Additional Factors Impacting ED Encounter Risk Consideration of hospitalization  Barth Kirks. Sedonia Small, [place] [email or web address]  Final Clinical Impressions(s) / ED Diagnoses     ICD-10-CM   1. Tachycardia  R00.0     2.  Palpitations  R00.2     3. Heart burn  R12     4. Lightheaded  R42       ED Discharge Orders     None        Discharge Instructions Discussed with and Provided to Patient:   Discharge Instructions   None      Maudie Flakes, MD 12/01/22 [phone]

## 2022-12-01 NOTE — ED Provider Notes (Signed)
Testing has been reassuring, the patient has been informed of his results, he has no tachycardia at this time with a heart rate of about 85 bpm.  He is normotensive and not hypoxic he has no shortness of breath no swelling of the legs and on my exam is in no distress.  He feels asymptomatic.  He will follow-up with his family doctor.  He feels like he has had increasing amounts of stress secondary to it being "tax season" and being a Engineer, maintenance ([employer]).  He agrees to return should symptoms worsen   Noemi Chapel, MD 12/01/22 [phone]

## 2022-12-01 NOTE — Discharge Instructions (Signed)
Please see your doctor this week for repeat check to make sure that your heart rate is doing well Your testing is otherwise been reassuring except for some mild signs of dehydration.  Thank you for allowing [place] to treat you in the emergency department today.  After reviewing your examination and potential testing that was done it appears that you are safe to go home.  I would like for you to follow-up with your doctor within the next several days, have them obtain your results and follow-up with them to review all of these tests.  If you should develop severe or worsening symptoms return to the emergency department immediately

## 2023-08-14 DIAGNOSIS — D225 Melanocytic nevi of trunk: Secondary | ICD-10-CM | POA: Diagnosis not present

## 2023-08-14 DIAGNOSIS — D2262 Melanocytic nevi of left upper limb, including shoulder: Secondary | ICD-10-CM | POA: Diagnosis not present

## 2023-08-14 DIAGNOSIS — D2261 Melanocytic nevi of right upper limb, including shoulder: Secondary | ICD-10-CM | POA: Diagnosis not present

## 2023-08-14 DIAGNOSIS — L570 Actinic keratosis: Secondary | ICD-10-CM | POA: Diagnosis not present

## 2023-08-14 DIAGNOSIS — L821 Other seborrheic keratosis: Secondary | ICD-10-CM | POA: Diagnosis not present

## 2023-09-15 DIAGNOSIS — M25531 Pain in right wrist: Secondary | ICD-10-CM | POA: Diagnosis not present

## 2023-10-06 DIAGNOSIS — M25531 Pain in right wrist: Secondary | ICD-10-CM | POA: Diagnosis not present

## 2023-10-13 DIAGNOSIS — S63591A Other specified sprain of right wrist, initial encounter: Secondary | ICD-10-CM | POA: Diagnosis not present

## 2023-10-29 DIAGNOSIS — E785 Hyperlipidemia, unspecified: Secondary | ICD-10-CM | POA: Diagnosis not present

## 2023-10-29 DIAGNOSIS — Z131 Encounter for screening for diabetes mellitus: Secondary | ICD-10-CM | POA: Diagnosis not present

## 2023-11-06 DIAGNOSIS — Z0000 Encounter for general adult medical examination without abnormal findings: Secondary | ICD-10-CM | POA: Diagnosis not present

## 2024-01-27 ENCOUNTER — Encounter: Payer: Self-pay | Admitting: Cardiovascular Disease

## 2024-01-27 NOTE — Progress Notes (Signed)
 Cardiology Office Note:    Date:  02/04/2024   ID:  Timothy Stout, Timothy Stout 14-May-1971, MRN [id number]  PCP:  Wyn Heater, MD  Cardiologist:   Rockland Kotarski  Referring MD: Katherleen Pancoast, MD    Problem List 1. Atypical CP 2. Migraine headaches  3. Arthroscopic Stout surgery .  4.  Obstructive sleep apnea - borderline    Chief Complaint  Patient presents with   Chest Pain    Previous notes from 2019    Timothy Stout is a 53 y.o. male with a hx of chest discomfort in the past.  We are asked to see him today by Dr. Darlean Edwards for some recurrent episodes of chest discomfort.  Timothy Stout has had episodic chest pain for the past couple of years.  He has seen Dr. Micael Adas in the past.  He had a normal stress test.  He is continued to have intermittent episodes of chest discomfort.  These seem to be related to the foods that he might eat.  The pain seems to be located towards the right or towards the left side of his chest.     the CP is not exertional. Feels like MSK pain.   Has been back on the Elliptical and he is working out regularly without any CP.  Lifted weights without any issues. Last night, he worked out - HR of 171   , had no CP  No dyspnea, no syncope or presyncope. , no PND or orthopnea.   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    Jan. 21, 2022: Timothy Stout is seen today for follow up of his atypical chest pain .  He was seen by Elodia Hailstone, NP in Jan. 2021 - virtual visit  He continues to have atypical CP  Coronary calcium score from Feb. 2021 showed:  Coronary calcium score of 0. This was 0 percentile for age and sex matched control.  Has noticed a bit more fatigue than usual. Stopped working out for a while. Still does the elliptical  sporatically now - used to exercise regularly , but notices that he is more fatigued than he thought he should have been ( more dyspnea while carrying in wood )  Is intermittant  Wears his HR monitor with exercise,  Gets his HR 155-160 at peak  exercise  Sleeping ok, eating ok Had Covid in Aug.  Had fever for 12 days    Mar 12, 2022 Timothy Stout is seen today for follow up of his atypical CP and dyspnea  Has a pinched nerve in his neck.   Causing some radiaton of pain down his left shoulder on occasion Better on NSAIDs Just got back from [place] last week. No Cp or DOE   February 04, 2024 Timothy Stout is seen for follow up of his atypical CP and dyspnea  Not much exercise  Owns a CPA firm,   has been busy   Recent cholesterol labs reveal a total  ( Jan. 15, 2025_ cholesterol of 200  HDL is 42  LDL is 123  triglyceride level is 195   Past Medical History:  Diagnosis Date   Abnormal LFTs    Acute hepatitis    Diverticulitis    GERD (gastroesophageal reflux disease)    Hyperlipidemia    Migraine    OSA (obstructive sleep apnea)    MILD   Rotator cuff tear, right    Thumb weakness    LEFT    Past Surgical History:  Procedure Laterality Date   INGUINAL HERNIA  REPAIR  1993   Stout ARTHROSCOPY  09-06-2009   LEFT Stout   LUMBAR LAMINECTOMY/DECOMPRESSION MICRODISCECTOMY  02-17-2001   L5 - S1   RIGHT Stout ARTHROSCOPY  1999   SHOULDER ARTHROSCOPY WITH LABRAL REPAIR  08/19/2012   Procedure: SHOULDER ARTHROSCOPY WITH LABRAL REPAIR;  Surgeon: Verlinda Gloss, MD;  Location: [facility];  Service: Orthopedics;  Laterality: Right;  shaving of rotaor cuff   SHOULDER ARTHROSCOPY WITH SUBACROMIAL DECOMPRESSION  08/19/2012   Procedure: SHOULDER ARTHROSCOPY WITH SUBACROMIAL DECOMPRESSION;  Surgeon: Verlinda Gloss, MD;  Location: [facility];  Service: Orthopedics;  Laterality: Right;    Current Medications: Current Meds  Medication Sig   Coenzyme Q10 (CO Q 10) 100 MG CAPS Take 100 mg by mouth daily.   magnesium  oxide (MAG-OX) 400 MG tablet Take 400 mg by mouth daily.   meloxicam (MOBIC) 15 MG tablet Take 15 mg by mouth daily as needed for pain.   Multiple Vitamin (MULTIVITAMIN ADULT PO) Take 1 tablet  by mouth daily.   RIBOFLAVIN  PO Take 1 tablet by mouth daily.   rizatriptan (MAXALT) 10 MG tablet Take 10 mg by mouth as needed for migraine.   TURMERIC PO Take 1 capsule by mouth daily.   valACYclovir (VALTREX) 1000 MG tablet Take 1,000 mg by mouth 2 (two) times daily as needed.   VITAMIN D PO Take 1 tablet by mouth daily in the afternoon.     Allergies:   Lidocaine    Social History   Socioeconomic History   Marital status: Married    Spouse name: Not on file   Number of children: Not on file   Years of education: Not on file   Highest education level: Not on file  Occupational History   Not on file  Tobacco Use   Smoking status: Never   Smokeless tobacco: Never  Substance and Sexual Activity   Alcohol use: Yes    Comment: OCCASIONAL   Drug use: No   Sexual activity: Not Currently  Other Topics Concern   Not on file  Social History Narrative   Not on file   Social Drivers of Health   Financial Resource Strain: Low Risk  (08/15/2021)   Received from [facility], [facility]   Overall Financial Resource Strain (CARDIA)    Difficulty of Paying Living Expenses: Not hard at all  Food Insecurity: No Food Insecurity (07/29/2021)   Received from [facility], [facility]   Hunger Vital Sign    Worried About Running Out of Food in the Last Year: Never true    Ran Out of Food in the Last Year: Never true  Transportation Needs: No Transportation Needs (07/29/2021)   Received from [employer], [facility]   PRAPARE - Transportation    Lack of Transportation (Medical): No    Lack of Transportation (Non-Medical): No  Physical Activity: Inactive (07/29/2021)   Received from [facility], [facility]   Exercise Vital Sign    Days of Exercise per Week: 0 days    Minutes of Exercise per Session: 0 min  Stress: Stress Concern Present (08/15/2021)   Received from [facility], [facility] of Occupational Health - Occupational Stress  Questionnaire    Feeling of Stress : To some extent  Social Connections: Unknown (02/25/2022)   Received from [facility], [facility]   Social Network    Social Network: Not on file     Family History: The patient's family history includes  Alzheimer's disease in his father; COPD in his father; Cancer - Colon in his father; Diabetes in his father; Heart attack in his maternal grandmother; Heart failure in his mother; Lupus in his mother; Prostate cancer in his father.  ROS:   Please see the history of present illness.     All other systems reviewed and are negative.  EKGs/Labs/Other Studies Reviewed:    The following studies were reviewed today:     Recent Labs: No results found for requested labs within last 365 days.  Recent Lipid Panel No results found for: "CHOL", "TRIG", "HDL", "CHOLHDL", "VLDL", "LDLCALC", "LDLDIRECT"  Physical Exam:     Physical Exam: Blood pressure 116/80, pulse (!) 104, height 5\' 10"  (1.778 m), weight 220 lb (99.8 kg), SpO2 96%.       GEN:  Well nourished, well developed in no acute distress HEENT: Normal NECK: No JVD; No carotid bruits LYMPHATICS: No lymphadenopathy CARDIAC: RRR , no murmurs, rubs, gallops RESPIRATORY:  Clear to auscultation without rales, wheezing or rhonchi  ABDOMEN: Soft, non-tender, non-distended MUSCULOSKELETAL:  No edema; No deformity  SKIN: Warm and dry NEUROLOGIC:  Alert and oriented x 3   EKG:     EKG Interpretation Date/Time:  Wednesday February 04 2024 08:22:58 EDT Ventricular Rate:  100 PR Interval:  160 QRS Duration:  102 QT Interval:  346 QTC Calculation: 446 R Axis:   72  Text Interpretation: Normal sinus rhythm Inferior infarct , age undetermined When compared with ECG of 01-Dec-2022 07:38, No significant change since last tracing Confirmed by Ahmad Alert ([id number]) on 02/04/2024 8:32:11 AM      ASSESSMENT:    1. Follow-up exam      PLAN:     Atypical chest pain:    He is not having much  in the way of chest pain.  Mostly indigestion-like pains.   2.  Dyspnea on exertion:   I have encouraged him to get out and exercise on a regular basis.  He has not been exercising.  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   2.  Mild hyperlipidemia:   He will be trying the Mediterranean diet.  He will continue to get lipids measured at his primary medical doctor.  Medication Adjustments/Labs and Tests Ordered: Current medicines are reviewed at length with the patient today.  Concerns regarding medicines are outlined above.  Orders Placed This Encounter  Procedures   EKG 12-Lead   No orders of the defined types were placed in this encounter.  Will have him follow up in 1 year     Signed, Ahmad Alert, MD  02/04/2024 8:45 AM    [facility]

## 2024-02-04 ENCOUNTER — Encounter: Payer: Self-pay | Admitting: Cardiovascular Disease

## 2024-02-04 ENCOUNTER — Ambulatory Visit: Attending: Cardiovascular Disease | Admitting: Cardiovascular Disease

## 2024-02-04 VITALS — BP 116/80 | HR 104 | Ht 70.0 in | Wt 220.0 lb

## 2024-02-04 DIAGNOSIS — Z09 Encounter for follow-up examination after completed treatment for conditions other than malignant neoplasm: Secondary | ICD-10-CM

## 2024-02-04 DIAGNOSIS — R0789 Other chest pain: Secondary | ICD-10-CM

## 2024-02-04 NOTE — Patient Instructions (Signed)
 Follow-Up: At [facility], you and your health needs are our priority.  As part of our continuing mission to provide you with exceptional heart care, our providers are all part of one team.  This team includes your primary Cardiologist (physician) and Advanced Practice Providers or APPs (Physician Assistants and Nurse Practitioners) who all work together to provide you with the care you need, when you need it.  Your next appointment:   1 year(s)  Provider:   Kristeen Miss, MD     1st Floor: - Lobby - Registration  - Pharmacy  - Lab - Cafe  2nd Floor: - PV Lab - Diagnostic Testing (echo, CT, nuclear med)  3rd Floor: - Vacant  4th Floor: - TCTS (cardiothoracic surgery) - AFib Clinic - Structural Heart Clinic - Vascular Surgery  - Vascular Ultrasound  5th Floor: - HeartCare Cardiology (general and EP) - Clinical Pharmacy for coumadin, hypertension, lipid, weight-loss medications, and med management appointments    Valet parking services will be available as well.

## 2024-02-23 DIAGNOSIS — Z792 Long term (current) use of antibiotics: Secondary | ICD-10-CM | POA: Diagnosis not present

## 2024-02-23 DIAGNOSIS — S70362A Insect bite (nonvenomous), left thigh, initial encounter: Secondary | ICD-10-CM | POA: Diagnosis not present

## 2024-04-06 DIAGNOSIS — H25043 Posterior subcapsular polar age-related cataract, bilateral: Secondary | ICD-10-CM | POA: Diagnosis not present

## 2024-04-06 DIAGNOSIS — H18413 Arcus senilis, bilateral: Secondary | ICD-10-CM | POA: Diagnosis not present

## 2024-04-06 DIAGNOSIS — H2513 Age-related nuclear cataract, bilateral: Secondary | ICD-10-CM | POA: Diagnosis not present

## 2024-04-06 DIAGNOSIS — H2512 Age-related nuclear cataract, left eye: Secondary | ICD-10-CM | POA: Diagnosis not present

## 2024-04-06 DIAGNOSIS — H25013 Cortical age-related cataract, bilateral: Secondary | ICD-10-CM | POA: Diagnosis not present

## 2024-05-24 DIAGNOSIS — H2512 Age-related nuclear cataract, left eye: Secondary | ICD-10-CM | POA: Diagnosis not present

## 2024-06-28 DIAGNOSIS — J411 Mucopurulent chronic bronchitis: Secondary | ICD-10-CM | POA: Diagnosis not present

## 2024-06-28 DIAGNOSIS — J329 Chronic sinusitis, unspecified: Secondary | ICD-10-CM | POA: Diagnosis not present

## 2024-08-16 DIAGNOSIS — B078 Other viral warts: Secondary | ICD-10-CM | POA: Diagnosis not present

## 2024-08-16 DIAGNOSIS — L718 Other rosacea: Secondary | ICD-10-CM | POA: Diagnosis not present

## 2024-08-16 DIAGNOSIS — L570 Actinic keratosis: Secondary | ICD-10-CM | POA: Diagnosis not present

## 2024-08-16 DIAGNOSIS — L821 Other seborrheic keratosis: Secondary | ICD-10-CM | POA: Diagnosis not present

## 2024-08-16 DIAGNOSIS — D225 Melanocytic nevi of trunk: Secondary | ICD-10-CM | POA: Diagnosis not present

## 2024-09-03 DIAGNOSIS — Z23 Encounter for immunization: Secondary | ICD-10-CM | POA: Diagnosis not present

## 2024-09-19 DIAGNOSIS — H00025 Hordeolum internum left lower eyelid: Secondary | ICD-10-CM | POA: Diagnosis not present
